# Patient Record
Sex: Female | Born: 1970 | Race: White | Hispanic: No | Smoking: Current every day smoker
Health system: Southern US, Community
[De-identification: ages and names within clinical notes are randomized; demographics above are authoritative.]

## PROBLEM LIST (undated history)

## (undated) DIAGNOSIS — I10 Essential (primary) hypertension: Secondary | ICD-10-CM

## (undated) DIAGNOSIS — G43909 Migraine, unspecified, not intractable, without status migrainosus: Secondary | ICD-10-CM

## (undated) DIAGNOSIS — H5440 Blindness, one eye, unspecified eye: Secondary | ICD-10-CM

## (undated) DIAGNOSIS — R32 Unspecified urinary incontinence: Secondary | ICD-10-CM

## (undated) DIAGNOSIS — I639 Cerebral infarction, unspecified: Secondary | ICD-10-CM

## (undated) DIAGNOSIS — E042 Nontoxic multinodular goiter: Secondary | ICD-10-CM

## (undated) DIAGNOSIS — C4490 Unspecified malignant neoplasm of skin, unspecified: Secondary | ICD-10-CM

## (undated) DIAGNOSIS — K219 Gastro-esophageal reflux disease without esophagitis: Secondary | ICD-10-CM

## (undated) DIAGNOSIS — I341 Nonrheumatic mitral (valve) prolapse: Secondary | ICD-10-CM

## (undated) DIAGNOSIS — I959 Hypotension, unspecified: Secondary | ICD-10-CM

## (undated) DIAGNOSIS — R079 Chest pain, unspecified: Secondary | ICD-10-CM

## (undated) DIAGNOSIS — E785 Hyperlipidemia, unspecified: Secondary | ICD-10-CM

## (undated) DIAGNOSIS — H409 Unspecified glaucoma: Secondary | ICD-10-CM

## (undated) DIAGNOSIS — E119 Type 2 diabetes mellitus without complications: Secondary | ICD-10-CM

## (undated) HISTORY — DX: Unspecified urinary incontinence: R32

## (undated) HISTORY — PX: FOOT SURGERY: SHX648

## (undated) HISTORY — DX: Hyperlipidemia, unspecified: E78.5

## (undated) HISTORY — PX: TUBAL LIGATION: SHX77

## (undated) HISTORY — DX: Unspecified malignant neoplasm of skin, unspecified: C44.90

## (undated) HISTORY — DX: Gastro-esophageal reflux disease without esophagitis: K21.9

## (undated) HISTORY — DX: Blindness, one eye, unspecified eye: H54.40

## (undated) HISTORY — DX: Cerebral infarction, unspecified: I63.9

## (undated) HISTORY — PX: NM RENAL LASIX (ARMC HX): HXRAD1213

## (undated) HISTORY — DX: Migraine, unspecified, not intractable, without status migrainosus: G43.909

## (undated) HISTORY — DX: Nontoxic multinodular goiter: E04.2

## (undated) HISTORY — DX: Essential (primary) hypertension: I10

## (undated) HISTORY — DX: Type 2 diabetes mellitus without complications: E11.9

## (undated) HISTORY — DX: Unspecified glaucoma: H40.9

## (undated) HISTORY — PX: BREAST ENHANCEMENT SURGERY: SHX7

## (undated) HISTORY — DX: Nonrheumatic mitral (valve) prolapse: I34.1

## (undated) HISTORY — PX: NOSE SURGERY: SHX723

---

## 1997-11-22 ENCOUNTER — Encounter: Admission: RE | Admit: 1997-11-22 | Discharge: 1998-02-20 | Payer: Self-pay | Admitting: Family Medicine

## 1998-01-17 ENCOUNTER — Emergency Department (HOSPITAL_COMMUNITY): Admission: EM | Admit: 1998-01-17 | Discharge: 1998-01-17 | Payer: Self-pay | Admitting: Emergency Medicine

## 1998-01-18 ENCOUNTER — Inpatient Hospital Stay (HOSPITAL_COMMUNITY): Admission: RE | Admit: 1998-01-18 | Discharge: 1998-01-23 | Payer: Self-pay | Admitting: *Deleted

## 1999-06-16 ENCOUNTER — Other Ambulatory Visit: Admission: RE | Admit: 1999-06-16 | Discharge: 1999-06-16 | Payer: Self-pay | Admitting: Obstetrics and Gynecology

## 1999-06-18 ENCOUNTER — Encounter: Admission: RE | Admit: 1999-06-18 | Discharge: 1999-06-18 | Payer: Self-pay | Admitting: Urology

## 1999-06-18 ENCOUNTER — Encounter: Payer: Self-pay | Admitting: Urology

## 2001-06-28 ENCOUNTER — Other Ambulatory Visit: Admission: RE | Admit: 2001-06-28 | Discharge: 2001-06-28 | Payer: Self-pay | Admitting: Obstetrics and Gynecology

## 2001-07-28 ENCOUNTER — Encounter: Payer: Self-pay | Admitting: Emergency Medicine

## 2001-07-28 ENCOUNTER — Emergency Department (HOSPITAL_COMMUNITY): Admission: EM | Admit: 2001-07-28 | Discharge: 2001-07-28 | Payer: Self-pay | Admitting: Emergency Medicine

## 2001-12-30 ENCOUNTER — Encounter: Admission: RE | Admit: 2001-12-30 | Discharge: 2001-12-30 | Payer: Self-pay | Admitting: Family Medicine

## 2001-12-30 ENCOUNTER — Encounter: Payer: Self-pay | Admitting: Family Medicine

## 2002-09-21 ENCOUNTER — Emergency Department (HOSPITAL_COMMUNITY): Admission: EM | Admit: 2002-09-21 | Discharge: 2002-09-21 | Payer: Self-pay | Admitting: Emergency Medicine

## 2002-09-22 ENCOUNTER — Encounter: Payer: Self-pay | Admitting: Emergency Medicine

## 2002-09-22 ENCOUNTER — Ambulatory Visit (HOSPITAL_COMMUNITY): Admission: RE | Admit: 2002-09-22 | Discharge: 2002-09-22 | Payer: Self-pay | Admitting: Emergency Medicine

## 2002-11-07 ENCOUNTER — Encounter: Payer: Self-pay | Admitting: Gastroenterology

## 2002-11-07 ENCOUNTER — Encounter: Admission: RE | Admit: 2002-11-07 | Discharge: 2002-11-07 | Payer: Self-pay | Admitting: Gastroenterology

## 2003-01-30 ENCOUNTER — Encounter: Payer: Self-pay | Admitting: *Deleted

## 2003-01-30 ENCOUNTER — Emergency Department (HOSPITAL_COMMUNITY): Admission: EM | Admit: 2003-01-30 | Discharge: 2003-01-30 | Payer: Self-pay | Admitting: Emergency Medicine

## 2003-03-09 ENCOUNTER — Other Ambulatory Visit: Admission: RE | Admit: 2003-03-09 | Discharge: 2003-03-09 | Payer: Self-pay | Admitting: Obstetrics and Gynecology

## 2003-04-24 ENCOUNTER — Encounter: Admission: RE | Admit: 2003-04-24 | Discharge: 2003-04-24 | Payer: Self-pay | Admitting: Family Medicine

## 2003-04-24 ENCOUNTER — Encounter: Payer: Self-pay | Admitting: Family Medicine

## 2003-11-25 ENCOUNTER — Emergency Department (HOSPITAL_COMMUNITY): Admission: AD | Admit: 2003-11-25 | Discharge: 2003-11-25 | Payer: Self-pay | Admitting: Emergency Medicine

## 2004-01-18 ENCOUNTER — Encounter: Admission: RE | Admit: 2004-01-18 | Discharge: 2004-01-18 | Payer: Self-pay | Admitting: Family Medicine

## 2004-02-07 ENCOUNTER — Encounter: Admission: RE | Admit: 2004-02-07 | Discharge: 2004-02-07 | Payer: Self-pay | Admitting: Family Medicine

## 2004-06-02 ENCOUNTER — Other Ambulatory Visit: Admission: RE | Admit: 2004-06-02 | Discharge: 2004-06-02 | Payer: Self-pay | Admitting: Obstetrics and Gynecology

## 2004-11-06 ENCOUNTER — Inpatient Hospital Stay (HOSPITAL_COMMUNITY): Admission: AD | Admit: 2004-11-06 | Discharge: 2004-11-06 | Payer: Self-pay | Admitting: Obstetrics and Gynecology

## 2004-12-04 ENCOUNTER — Inpatient Hospital Stay (HOSPITAL_COMMUNITY): Admission: AD | Admit: 2004-12-04 | Discharge: 2004-12-06 | Payer: Self-pay | Admitting: Obstetrics and Gynecology

## 2005-01-14 ENCOUNTER — Ambulatory Visit (HOSPITAL_COMMUNITY): Admission: RE | Admit: 2005-01-14 | Discharge: 2005-01-14 | Payer: Self-pay | Admitting: Obstetrics and Gynecology

## 2007-06-03 ENCOUNTER — Emergency Department (HOSPITAL_COMMUNITY): Admission: EM | Admit: 2007-06-03 | Discharge: 2007-06-03 | Payer: Self-pay | Admitting: Emergency Medicine

## 2007-06-03 ENCOUNTER — Inpatient Hospital Stay (HOSPITAL_COMMUNITY): Admission: AD | Admit: 2007-06-03 | Discharge: 2007-06-04 | Payer: Self-pay | Admitting: Psychiatry

## 2007-06-03 ENCOUNTER — Ambulatory Visit: Payer: Self-pay | Admitting: Psychiatry

## 2010-09-14 ENCOUNTER — Encounter: Payer: Self-pay | Admitting: Family Medicine
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
## 2011-01-09 NOTE — Op Note (Signed)
NAME:  Kristen Rowland, Kristen Rowland                ACCOUNT NO.:  [id number]   MEDICAL RECORD NO.:  [id number]          PATIENT TYPE:  AMB   LOCATION:  DAY                          FACILITY:  [facility]   PHYSICIAN:  Malachi Pro. Ambrose Mantle, M.D. DATE OF BIRTH:  12/24/1970   DATE OF PROCEDURE:  01/14/2005  DATE OF DISCHARGE:                                 OPERATIVE REPORT   PREOPERATIVE DIAGNOSIS:  Voluntary sterilization.   POSTOPERATIVE DIAGNOSIS:  Voluntary sterilization.   OPERATION/PROCEDURE:  Laparoscopic tubal cauterization.   SURGEON:  Malachi Pro. Ambrose Mantle, M.D.   ANESTHESIA:  General.   DESCRIPTION OF PROCEDURE:  The patient was brought to the operating room and  placed under satisfactory general anesthesia and placed in the Allen  stirrups in a modified lithotomy position.  Exam revealed the uterus to be  anterior, normal size.  Adnexa free of masses.  The abdomen, vulva, vagina,  and urethra were prepped with Betadine solution and the bladder was emptied  with a [place] catheter.  A Hulka cannula was placed into the uterus and  attached to the anterior cervical lip.  The abdomen was then draped as a  sterile field.  A small incision was made in the inferior portion of the  umbilicus.  A Veress cannula was placed into the peritoneal cavity.  Aspiration revealed it was not in a blood vessel, not in bowel or urinary  contents.  Some of the saline was recovered so I replaced the catheter and  then it was definitely in the peritoneal cavity.  I insufflated the  peritoneal cavity with 3.5 L of CO2.  Then the laparoscopic trocar was  inserted into the peritoneal cavity and then the laparoscope was inserted.  A smaller incision was made suprapubically.  A smaller trocar was inserted  into the peritoneal cavity under direct vision.  Exploration of the upper  abdomen revealed the liver to be smooth and no abdominal abnormalities were  noted.  Exploration of the pelvis revealed a cul-de-sac posteriorly to be  free of disease.  The uterus was anterior and normal size.  The anterior cul-  de-sac was normal.  Right tube and ovary and left tube and ovary were  normal.  Both tubes were cauterized in three or four consecutive locations  with a bipolar current.  Good blanching occurred.  Cauterization was  continued until the needle read 0.  No complications were encountered.  The  lower abdominal trocar was removed.  There was no bleeding from this site.  The pneumoperitoneum was released and then the laparoscope was slowly  withdrawn through the umbilical incision.  There was no significant bleeding  here.  The umbilical incision was closed with interrupted sutures of 3-0  plain catgut.  A subcuticular suture was placed through the lower abdominal  incision and one Steri-Strips was used.  The Hulka cannula was removed and  the procedure was terminated.  Blood loss was less than 10 mL.  Sponge and  needle counts were correct and the patient was returned to recovery in  satisfactory condition.     TFH/MEDQ  D:  01/14/2005  T:  01/14/2005  Job:  [id number]

## 2011-06-04 LAB — URINALYSIS, ROUTINE W REFLEX MICROSCOPIC
Glucose, UA: NEGATIVE
Ketones, ur: NEGATIVE
Nitrite: NEGATIVE
Specific Gravity, Urine: 1.014
Urobilinogen, UA: 0.2
pH: 5.5

## 2011-06-04 LAB — RAPID URINE DRUG SCREEN, HOSP PERFORMED
Amphetamines: NOT DETECTED
Benzodiazepines: NOT DETECTED

## 2011-06-04 LAB — TSH
TSH: 2.089
TSH: 3.899

## 2011-06-04 LAB — COMPREHENSIVE METABOLIC PANEL
Albumin: 4
BUN: 3 — ABNORMAL LOW
Creatinine, Ser: 0.68
Glucose, Bld: 97
Potassium: 4
Sodium: 143
Total Protein: 6.8

## 2011-06-04 LAB — CBC
HCT: 37.3
Hemoglobin: 12.7
Platelets: 375
WBC: 10.1

## 2011-06-04 LAB — ACETAMINOPHEN LEVEL: Acetaminophen (Tylenol), Serum: <10 — ABNORMAL LOW

## 2011-06-04 LAB — DIFFERENTIAL
Eosinophils Absolute: 0.1
Eosinophils Relative: 1
Lymphs Abs: 2.3
Neutro Abs: 7.1

## 2011-06-04 LAB — ETHANOL: Alcohol, Ethyl (B): 71 — ABNORMAL HIGH

## 2011-06-04 LAB — URINE MICROSCOPIC-ADD ON

## 2014-07-02 ENCOUNTER — Inpatient Hospital Stay (HOSPITAL_COMMUNITY)
Admission: EM | Admit: 2014-07-02 | Discharge: 2014-07-10 | DRG: 082 | Disposition: A | Discharge status: Home / Self Care | Payer: Medicaid Other | Attending: General Surgery | Admitting: General Surgery

## 2014-07-02 ENCOUNTER — Emergency Department (HOSPITAL_COMMUNITY): Payer: Medicaid Other

## 2014-07-02 ENCOUNTER — Encounter (HOSPITAL_COMMUNITY): Payer: Self-pay | Admitting: Emergency Medicine

## 2014-07-02 DIAGNOSIS — S022XXA Fracture of nasal bones, initial encounter for closed fracture: Secondary | ICD-10-CM | POA: Diagnosis present

## 2014-07-02 DIAGNOSIS — F17210 Nicotine dependence, cigarettes, uncomplicated: Secondary | ICD-10-CM | POA: Diagnosis present

## 2014-07-02 DIAGNOSIS — S02401A Maxillary fracture, unspecified, initial encounter for closed fracture: Secondary | ICD-10-CM | POA: Diagnosis present

## 2014-07-02 DIAGNOSIS — F10129 Alcohol abuse with intoxication, unspecified: Secondary | ICD-10-CM | POA: Diagnosis present

## 2014-07-02 DIAGNOSIS — S01111A Laceration without foreign body of right eyelid and periocular area, initial encounter: Secondary | ICD-10-CM | POA: Diagnosis present

## 2014-07-02 DIAGNOSIS — F1029 Alcohol dependence with unspecified alcohol-induced disorder: Secondary | ICD-10-CM | POA: Diagnosis present

## 2014-07-02 DIAGNOSIS — S023XXA Fracture of orbital floor, initial encounter for closed fracture: Secondary | ICD-10-CM | POA: Diagnosis present

## 2014-07-02 DIAGNOSIS — I609 Nontraumatic subarachnoid hemorrhage, unspecified: Secondary | ICD-10-CM

## 2014-07-02 DIAGNOSIS — S0292XA Unspecified fracture of facial bones, initial encounter for closed fracture: Secondary | ICD-10-CM | POA: Diagnosis present

## 2014-07-02 DIAGNOSIS — S066XAA Traumatic subarachnoid hemorrhage with loss of consciousness status unknown, initial encounter: Secondary | ICD-10-CM | POA: Diagnosis present

## 2014-07-02 DIAGNOSIS — S069X9A Unspecified intracranial injury with loss of consciousness of unspecified duration, initial encounter: Secondary | ICD-10-CM

## 2014-07-02 DIAGNOSIS — M549 Dorsalgia, unspecified: Secondary | ICD-10-CM | POA: Diagnosis present

## 2014-07-02 DIAGNOSIS — J189 Pneumonia, unspecified organism: Secondary | ICD-10-CM | POA: Diagnosis not present

## 2014-07-02 DIAGNOSIS — S0181XA Laceration without foreign body of other part of head, initial encounter: Secondary | ICD-10-CM

## 2014-07-02 DIAGNOSIS — J9601 Acute respiratory failure with hypoxia: Secondary | ICD-10-CM | POA: Diagnosis not present

## 2014-07-02 DIAGNOSIS — M542 Cervicalgia: Secondary | ICD-10-CM

## 2014-07-02 DIAGNOSIS — S0990XA Unspecified injury of head, initial encounter: Secondary | ICD-10-CM

## 2014-07-02 DIAGNOSIS — Y906 Blood alcohol level of 120-199 mg/100 ml: Secondary | ICD-10-CM | POA: Diagnosis present

## 2014-07-02 DIAGNOSIS — S2002XA Contusion of left breast, initial encounter: Secondary | ICD-10-CM | POA: Diagnosis present

## 2014-07-02 DIAGNOSIS — S2232XA Fracture of one rib, left side, initial encounter for closed fracture: Secondary | ICD-10-CM | POA: Diagnosis present

## 2014-07-02 DIAGNOSIS — S066X9A Traumatic subarachnoid hemorrhage with loss of consciousness of unspecified duration, initial encounter: Secondary | ICD-10-CM | POA: Diagnosis present

## 2014-07-02 DIAGNOSIS — R40241 Glasgow coma scale score 13-15: Secondary | ICD-10-CM | POA: Diagnosis present

## 2014-07-02 DIAGNOSIS — F17200 Nicotine dependence, unspecified, uncomplicated: Secondary | ICD-10-CM | POA: Diagnosis present

## 2014-07-02 DIAGNOSIS — S069XAA Unspecified intracranial injury with loss of consciousness status unknown, initial encounter: Secondary | ICD-10-CM

## 2014-07-02 DIAGNOSIS — F1020 Alcohol dependence, uncomplicated: Secondary | ICD-10-CM | POA: Diagnosis present

## 2014-07-02 DIAGNOSIS — R0602 Shortness of breath: Secondary | ICD-10-CM

## 2014-07-02 DIAGNOSIS — J9600 Acute respiratory failure, unspecified whether with hypoxia or hypercapnia: Secondary | ICD-10-CM | POA: Diagnosis not present

## 2014-07-02 HISTORY — DX: Hypotension, unspecified: I95.9

## 2014-07-02 LAB — BASIC METABOLIC PANEL
Anion gap: 20 — ABNORMAL HIGH (ref 5–15)
BUN: 10 mg/dL (ref 6–23)
CO2: 21 mEq/L (ref 19–32)
Calcium: 8.8 mg/dL (ref 8.4–10.5)
Chloride: 95 mEq/L — ABNORMAL LOW (ref 96–112)
Creatinine, Ser: 0.67 mg/dL (ref 0.50–1.10)
GFR calc Af Amer: >90 mL/min (ref >90)
GFR calc non Af Amer: >90 mL/min (ref >90)
Glucose, Bld: 98 mg/dL (ref 70–99)
Potassium: 3.9 mEq/L (ref 3.7–5.3)
Sodium: 136 mEq/L — ABNORMAL LOW (ref 137–147)

## 2014-07-02 LAB — ETHANOL: Alcohol, Ethyl (B): 137 mg/dL — ABNORMAL HIGH (ref 0–11)

## 2014-07-02 LAB — TYPE AND SCREEN
ABO/RH(D): A POS
Antibody Screen: NEGATIVE

## 2014-07-02 LAB — CBC WITH DIFFERENTIAL/PLATELET
Basophils Absolute: 0.1 10*3/uL (ref 0.0–0.1)
Basophils Relative: 1 % (ref 0–1)
Eosinophils Absolute: 0.1 10*3/uL (ref 0.0–0.7)
Eosinophils Relative: 1 % (ref 0–5)
HCT: 40.5 % (ref 36.0–46.0)
Hemoglobin: 13.5 g/dL (ref 12.0–15.0)
Lymphocytes Relative: 18 % (ref 12–46)
Lymphs Abs: 2.5 10*3/uL (ref 0.7–4.0)
MCH: 31.8 pg (ref 26.0–34.0)
MCHC: 33.3 g/dL (ref 30.0–36.0)
MCV: 95.5 fL (ref 78.0–100.0)
Monocytes Absolute: 1.3 10*3/uL — ABNORMAL HIGH (ref 0.1–1.0)
Monocytes Relative: 10 % (ref 3–12)
Neutro Abs: 10 10*3/uL — ABNORMAL HIGH (ref 1.7–7.7)
Neutrophils Relative %: 72 % (ref 43–77)
Platelets: 408 10*3/uL — ABNORMAL HIGH (ref 150–400)
RBC: 4.24 MIL/uL (ref 3.87–5.11)
RDW: 13.7 % (ref 11.5–15.5)
WBC: 13.9 10*3/uL — ABNORMAL HIGH (ref 4.0–10.5)

## 2014-07-02 LAB — LACTIC ACID, PLASMA: Lactic Acid, Venous: 4.1 mmol/L — ABNORMAL HIGH (ref 0.5–2.2)

## 2014-07-02 LAB — ABO/RH: ABO/RH(D): A POS

## 2014-07-02 LAB — GLUCOSE, CAPILLARY: Glucose-Capillary: 101 mg/dL — ABNORMAL HIGH (ref 70–99)

## 2014-07-02 LAB — PREGNANCY, URINE: Preg Test, Ur: NEGATIVE

## 2014-07-02 MED ORDER — ONDANSETRON HCL 4 MG PO TABS
4.0000 mg | ORAL_TABLET | Freq: Four times a day (QID) | ORAL | Status: DC | PRN
  Administered 2014-07-03 – 2014-07-06 (×2): 4 mg via ORAL
  Filled 2014-07-02 (×3): qty 1

## 2014-07-02 MED ORDER — INFLUENZA VAC SPLIT QUAD 0.5 ML IM SUSY
0.5000 mL | PREFILLED_SYRINGE | INTRAMUSCULAR | Status: AC
  Administered 2014-07-03: 0.5 mL via INTRAMUSCULAR
  Filled 2014-07-02: qty 0.5

## 2014-07-02 MED ORDER — SODIUM CHLORIDE 0.9 % IV SOLN
INTRAVENOUS | Status: DC
  Administered 2014-07-02: via INTRAVENOUS
  Administered 2014-07-03: 100 mL/h via INTRAVENOUS
  Administered 2014-07-04 (×2): via INTRAVENOUS

## 2014-07-02 MED ORDER — FENTANYL CITRATE 0.05 MG/ML IJ SOLN
100.0000 ug | Freq: Once | INTRAMUSCULAR | Status: AC
  Administered 2014-07-02: 100 ug via INTRAVENOUS
  Filled 2014-07-02: qty 2

## 2014-07-02 MED ORDER — PANTOPRAZOLE SODIUM 40 MG IV SOLR
40.0000 mg | Freq: Every day | INTRAVENOUS | Status: DC
  Administered 2014-07-03: 40 mg via INTRAVENOUS
  Filled 2014-07-02 (×2): qty 40

## 2014-07-02 MED ORDER — ONDANSETRON HCL 4 MG/2ML IJ SOLN
4.0000 mg | Freq: Four times a day (QID) | INTRAMUSCULAR | Status: DC | PRN
  Administered 2014-07-02 – 2014-07-09 (×9): 4 mg via INTRAVENOUS
  Filled 2014-07-02 (×9): qty 2

## 2014-07-02 MED ORDER — IOHEXOL 300 MG/ML  SOLN: 100.0000 mL | Freq: Once | INTRAMUSCULAR | Status: DC | PRN

## 2014-07-02 MED ORDER — PNEUMOCOCCAL VAC POLYVALENT 25 MCG/0.5ML IJ INJ
0.5000 mL | INJECTION | INTRAMUSCULAR | Status: AC
  Administered 2014-07-03: 0.5 mL via INTRAMUSCULAR
  Filled 2014-07-02: qty 0.5

## 2014-07-02 MED ORDER — ONDANSETRON HCL 4 MG/2ML IJ SOLN
INTRAMUSCULAR | Status: AC
  Administered 2014-07-02: 4 mg via INTRAVENOUS
  Filled 2014-07-02: qty 2

## 2014-07-02 MED ORDER — MORPHINE SULFATE 2 MG/ML IJ SOLN
2.0000 mg | INTRAMUSCULAR | Status: DC | PRN
  Administered 2014-07-02: 4 mg via INTRAVENOUS
  Administered 2014-07-02 – 2014-07-03 (×5): 2 mg via INTRAVENOUS
  Administered 2014-07-03: 3 mg via INTRAVENOUS
  Administered 2014-07-03 (×3): 2 mg via INTRAVENOUS
  Administered 2014-07-03: 3 mg via INTRAVENOUS
  Administered 2014-07-03 – 2014-07-04 (×5): 2 mg via INTRAVENOUS
  Administered 2014-07-04: 3 mg via INTRAVENOUS
  Administered 2014-07-04 (×2): 2 mg via INTRAVENOUS
  Administered 2014-07-04: 3 mg via INTRAVENOUS
  Administered 2014-07-05 – 2014-07-08 (×13): 2 mg via INTRAVENOUS
  Filled 2014-07-02: qty 1
  Filled 2014-07-02: qty 2
  Filled 2014-07-02 (×6): qty 1
  Filled 2014-07-02: qty 2
  Filled 2014-07-02 (×3): qty 1
  Filled 2014-07-02: qty 2
  Filled 2014-07-02 (×3): qty 1
  Filled 2014-07-02: qty 2
  Filled 2014-07-02: qty 1
  Filled 2014-07-02: qty 2
  Filled 2014-07-02 (×9): qty 1
  Filled 2014-07-02: qty 2
  Filled 2014-07-02 (×4): qty 1

## 2014-07-02 MED ORDER — PANTOPRAZOLE SODIUM 40 MG PO TBEC
40.0000 mg | DELAYED_RELEASE_TABLET | Freq: Every day | ORAL | Status: DC
  Administered 2014-07-05 – 2014-07-07 (×3): 40 mg via ORAL
  Filled 2014-07-02 (×4): qty 1

## 2014-07-02 MED ORDER — LIDOCAINE-EPINEPHRINE 1 %-1:100000 IJ SOLN
10.0000 mL | Freq: Once | INTRAMUSCULAR | Status: AC
  Administered 2014-07-02: 10 mL
  Filled 2014-07-02: qty 1

## 2014-07-02 MED ORDER — ONDANSETRON HCL 4 MG/2ML IJ SOLN
4.0000 mg | Freq: Once | INTRAMUSCULAR | Status: AC
  Administered 2014-07-02: 4 mg via INTRAVENOUS

## 2014-07-02 MED ORDER — SODIUM CHLORIDE 0.9 % IV BOLUS (SEPSIS)
1000.0000 mL | Freq: Once | INTRAVENOUS | Status: AC
  Administered 2014-07-02: 1000 mL via INTRAVENOUS

## 2014-07-02 NOTE — ED Notes (Signed)
Neuro Surgeon called Dr. Tanna SavoyPlunket back.

## 2014-07-02 NOTE — ED Notes (Signed)
Contacted family member Bernette RedbirdKenny at patient request. [phone] Bernette RedbirdKenny states he is en route
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
## 2014-07-02 NOTE — ED Notes (Signed)
Attempted to give report, room occupied. Flow called.
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
## 2014-07-02 NOTE — ED Provider Notes (Signed)
LACERATION REPAIR Performed by: [place]Jordan Hausladen, PA Student Authorized by: Santiago GladLaisure, Waleed Dettman Consent: Verbal consent obtained. Risks and benefits: risks, benefits and alternatives were discussed Consent given by: patient Patient identity confirmed: provided demographic data Prepped and Draped in normal sterile fashion Wound explored  Laceration Location: chin  Laceration Length: 6 cm  No Foreign Bodies seen or palpated  Anesthesia: local infiltration  Local anesthetic: lidocaine 2% with epinephrine  Anesthetic total: 5 ml  Irrigation method: syringe Amount of cleaning: standard  Skin closure: 5-0 Prolene  Number of sutures: 9  Technique: simple interrupted  Patient tolerance: Patient tolerated the procedure well with no immediate complications.   LACERATION REPAIR Performed by: Santiago GladLaisure, Dail Meece Authorized by: Santiago GladLaisure, Noal Abshier Consent: Verbal consent obtained. Risks and benefits: risks, benefits and alternatives were discussed Consent given by: patient Patient identity confirmed: provided demographic data Prepped and Draped in normal sterile fashion Wound explored  Laceration Location: just superior to right upper lip  Laceration Length: 1 cm  No Foreign Bodies seen or palpated  Anesthesia: local infiltration  Local anesthetic: lidocaine 2% with epinephrine  Anesthetic total: 1 ml  Irrigation method: syringe Amount of cleaning: standard  Skin closure: 5-0 Prolene  Number of sutures: 1  Technique: simple interrupted  Patient tolerance: Patient tolerated the procedure well with no immediate complications.   LACERATION REPAIR Performed by: Santiago GladLaisure, Aaralyn Kil Authorized by: Santiago GladLaisure, Navya Timmons Consent: Verbal consent obtained. Risks and benefits: risks, benefits and alternatives were discussed Consent given by: patient Patient identity confirmed: provided demographic data Prepped and Draped in normal sterile fashion Wound explored  Laceration  Location:  Right nasolabial fold  Laceration Length:2 cm  No Foreign Bodies seen or palpated  Anesthesia: local infiltration  Local anesthetic: lidocaine 2% with epinephrine  Anesthetic total: 2 ml  Irrigation method: syringe Amount of cleaning: standard  Skin closure: 5-0 Prolone  Number of sutures: 3  Technique: simple interrupted  Patient tolerance: Patient tolerated the procedure well with no immediate complications.     Santiago GladHeather Maryjayne Kleven, PA-C 07/02/14 1744  Gwyneth SproutWhitney Plunkett, MD 07/03/14 1530

## 2014-07-02 NOTE — ED Notes (Signed)
PA at the bedside to suture pts lacerations.

## 2014-07-02 NOTE — ED Notes (Signed)
Flow called to inquire about bed request.  
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
## 2014-07-02 NOTE — ED Notes (Signed)
Neuro Surgeon at bedside.  

## 2014-07-02 NOTE — ED Notes (Signed)
Patient transported to CT 
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
## 2014-07-02 NOTE — ED Notes (Signed)
Neuro Surgery paged.

## 2014-07-02 NOTE — ED Notes (Signed)
[facility]

## 2014-07-02 NOTE — ED Notes (Signed)
[phone] Kristen Rowland(Kenneth, boyfriend) Please call if you need anything.

## 2014-07-03 ENCOUNTER — Inpatient Hospital Stay (HOSPITAL_COMMUNITY): Payer: Medicaid Other

## 2014-07-03 LAB — CBC
HCT: 36.8 % (ref 36.0–46.0)
Hemoglobin: 12.2 g/dL (ref 12.0–15.0)
MCH: 31.4 pg (ref 26.0–34.0)
MCHC: 33.2 g/dL (ref 30.0–36.0)
MCV: 94.8 fL (ref 78.0–100.0)
PLATELETS: 364 10*3/uL (ref 150–400)
RBC: 3.88 MIL/uL (ref 3.87–5.11)
RDW: 13.8 % (ref 11.5–15.5)
WBC: 10.3 10*3/uL (ref 4.0–10.5)

## 2014-07-03 LAB — MRSA PCR SCREENING: MRSA by PCR: NEGATIVE

## 2014-07-03 LAB — BASIC METABOLIC PANEL
ANION GAP: 17 — AB (ref 5–15)
BUN: 10 mg/dL (ref 6–23)
CHLORIDE: 102 meq/L (ref 96–112)
CO2: 20 meq/L (ref 19–32)
Calcium: 8.5 mg/dL (ref 8.4–10.5)
Creatinine, Ser: 0.71 mg/dL (ref 0.50–1.10)
GFR calc Af Amer: >90 mL/min (ref >90)
GFR calc non Af Amer: >90 mL/min (ref >90)
Glucose, Bld: 93 mg/dL (ref 70–99)
Potassium: 4.2 mEq/L (ref 3.7–5.3)
SODIUM: 139 meq/L (ref 137–147)

## 2014-07-03 MED ORDER — SODIUM CHLORIDE (HYPERTONIC) 5 % OP OINT
TOPICAL_OINTMENT | Freq: Every day | OPHTHALMIC | Status: DC
  Filled 2014-07-03: qty 3.5

## 2014-07-03 MED ORDER — LORAZEPAM 1 MG PO TABS
1.0000 mg | ORAL_TABLET | Freq: Four times a day (QID) | ORAL | Status: DC | PRN
  Administered 2014-07-04: 1 mg via ORAL
  Filled 2014-07-03: qty 1

## 2014-07-03 MED ORDER — SODIUM CHLORIDE (HYPERTONIC) 5 % OP OINT
TOPICAL_OINTMENT | OPHTHALMIC | Status: AC
  Administered 2014-07-03 – 2014-07-04 (×5): via OPHTHALMIC
  Filled 2014-07-03: qty 3.5

## 2014-07-03 MED ORDER — CETYLPYRIDINIUM CHLORIDE 0.05 % MT LIQD
7.0000 mL | Freq: Two times a day (BID) | OROMUCOSAL | Status: DC
  Administered 2014-07-03 – 2014-07-09 (×11): 7 mL via OROMUCOSAL

## 2014-07-03 MED ORDER — THIAMINE HCL 100 MG/ML IJ SOLN
100.0000 mg | Freq: Every day | INTRAMUSCULAR | Status: DC
  Filled 2014-07-03 (×2): qty 1

## 2014-07-03 MED ORDER — CHLORHEXIDINE GLUCONATE 0.12 % MT SOLN
15.0000 mL | Freq: Two times a day (BID) | OROMUCOSAL | Status: DC
  Administered 2014-07-03 – 2014-07-10 (×15): 15 mL via OROMUCOSAL
  Filled 2014-07-03 (×17): qty 15

## 2014-07-03 MED ORDER — LORAZEPAM 1 MG PO TABS
0.0000 mg | ORAL_TABLET | Freq: Four times a day (QID) | ORAL | Status: DC
Start: 2014-07-03 — End: 2014-07-04
  Administered 2014-07-03 – 2014-07-04 (×2): 3 mg via ORAL
  Administered 2014-07-04: 2 mg via ORAL
  Filled 2014-07-03: qty 3
  Filled 2014-07-03: qty 2
  Filled 2014-07-03: qty 4

## 2014-07-03 MED ORDER — LORAZEPAM 2 MG/ML IJ SOLN: 1.0000 mg | Freq: Four times a day (QID) | INTRAMUSCULAR | Status: DC | PRN

## 2014-07-03 MED ORDER — FOLIC ACID 1 MG PO TABS
1.0000 mg | ORAL_TABLET | Freq: Every day | ORAL | Status: DC
  Administered 2014-07-03 – 2014-07-10 (×8): 1 mg via ORAL
  Filled 2014-07-03 (×8): qty 1

## 2014-07-03 MED ORDER — VITAMIN B-1 100 MG PO TABS
100.0000 mg | ORAL_TABLET | Freq: Every day | ORAL | Status: DC
  Administered 2014-07-03 – 2014-07-10 (×8): 100 mg via ORAL
  Filled 2014-07-03 (×8): qty 1

## 2014-07-03 MED ORDER — LORAZEPAM 1 MG PO TABS: 0.0000 mg | ORAL_TABLET | Freq: Two times a day (BID) | ORAL | Status: DC

## 2014-07-03 MED ORDER — ADULT MULTIVITAMIN W/MINERALS CH
1.0000 | ORAL_TABLET | Freq: Every day | ORAL | Status: DC
  Administered 2014-07-03 – 2014-07-10 (×7): 1 via ORAL
  Filled 2014-07-03 (×8): qty 1

## 2014-07-03 MED ORDER — NICOTINE 21 MG/24HR TD PT24
21.0000 mg | MEDICATED_PATCH | Freq: Every day | TRANSDERMAL | Status: DC
  Administered 2014-07-03 – 2014-07-10 (×8): 21 mg via TRANSDERMAL
  Filled 2014-07-03 (×8): qty 1

## 2014-07-03 NOTE — Progress Notes (Signed)
UR completed.  Sonal Dorwart, RN BSN [facility] CCM Trauma/Neuro ICU Case Manager [phone]  
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
## 2014-07-03 NOTE — Progress Notes (Signed)
OT Cancellation Note  Patient Details Name: Kristen BloodgoodCarla B Rowland MRN: [id number] DOB: June 16, 1971   Cancelled Treatment:    Reason Eval/Treat Not Completed: Other (comment) (transferring units) - will reattempt.   Angelene GiovanniConarpe, Lynda Capistran M  Daneya Hartgrove Ghentonarpe, OTR/L [phone] 07/03/2014, 3:27 PM
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
## 2014-07-03 NOTE — Plan of Care (Signed)
Problem: Consults Goal: General Medical Patient Education See Patient Education Module for specific education. Outcome: Completed/Met Date Met:  07/03/14 Goal: Skin Care Protocol Initiated - if Braden Score 18 or less If consults are not indicated, leave blank or document N/A Outcome: Completed/Met Date Met:  07/03/14 Goal: Nutrition Consult-if indicated Outcome: Not Applicable Date Met:  62/03/55 Goal: Diabetes Guidelines if Diabetic/Glucose > 140 If diabetic or lab glucose is > 140 mg/dl - Initiate Diabetes/Hyperglycemia Guidelines & Document Interventions  Outcome: Not Applicable Date Met:  97/41/63  Problem: Phase I Progression Outcomes Goal: Pain controlled with appropriate interventions Outcome: Progressing Goal: OOB as tolerated unless otherwise ordered Outcome: Progressing Goal: Voiding-avoid urinary catheter unless indicated Outcome: Completed/Met Date Met:  07/03/14 Goal: Hemodynamically stable Outcome: Completed/Met Date Met:  07/03/14 Goal: Other Phase I Outcomes/Goals Outcome: Completed/Met Date Met:  07/03/14
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
## 2014-07-03 NOTE — Progress Notes (Signed)
SLP Cancellation Note  Patient Details Name: Kristen BloodgoodCarla B Leyba MRN: [id number] DOB: 1971/07/31   Cancelled evaluation:       Reason Eval/Treat Not Completed: Patient unavailable; will return this pm or next date as schedule allows.     Blenda MountsCouture, Sally-Anne Wamble Laurice 07/03/2014, 12:01 PM
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
## 2014-07-04 ENCOUNTER — Inpatient Hospital Stay (HOSPITAL_COMMUNITY): Payer: Medicaid Other

## 2014-07-04 LAB — BASIC METABOLIC PANEL
ANION GAP: 17 — AB (ref 5–15)
BUN: 6 mg/dL (ref 6–23)
CHLORIDE: 100 meq/L (ref 96–112)
CO2: 21 meq/L (ref 19–32)
Calcium: 8.3 mg/dL — ABNORMAL LOW (ref 8.4–10.5)
Creatinine, Ser: 0.56 mg/dL (ref 0.50–1.10)
GFR calc non Af Amer: >90 mL/min (ref >90)
Glucose, Bld: 89 mg/dL (ref 70–99)
POTASSIUM: 4.2 meq/L (ref 3.7–5.3)
SODIUM: 138 meq/L (ref 137–147)

## 2014-07-04 LAB — CBC
HCT: 33.8 % — ABNORMAL LOW (ref 36.0–46.0)
Hemoglobin: 11.1 g/dL — ABNORMAL LOW (ref 12.0–15.0)
MCH: 31.7 pg (ref 26.0–34.0)
MCHC: 32.8 g/dL (ref 30.0–36.0)
MCV: 96.6 fL (ref 78.0–100.0)
PLATELETS: 354 10*3/uL (ref 150–400)
RBC: 3.5 MIL/uL — AB (ref 3.87–5.11)
RDW: 13.9 % (ref 11.5–15.5)
WBC: 15.1 10*3/uL — ABNORMAL HIGH (ref 4.0–10.5)

## 2014-07-04 MED ORDER — LEVOFLOXACIN IN D5W 750 MG/150ML IV SOLN
750.0000 mg | INTRAVENOUS | Status: DC
  Administered 2014-07-04 – 2014-07-06 (×3): 750 mg via INTRAVENOUS
  Filled 2014-07-04 (×4): qty 150

## 2014-07-04 MED ORDER — LORAZEPAM 2 MG/ML IJ SOLN
2.0000 mg | INTRAMUSCULAR | Status: DC | PRN
  Administered 2014-07-06: 2 mg via INTRAVENOUS
  Filled 2014-07-04: qty 1

## 2014-07-04 MED ORDER — ALBUTEROL SULFATE (2.5 MG/3ML) 0.083% IN NEBU
2.5000 mg | INHALATION_SOLUTION | RESPIRATORY_TRACT | Status: DC
  Administered 2014-07-04 – 2014-07-08 (×23): 2.5 mg via RESPIRATORY_TRACT
  Filled 2014-07-04 (×25): qty 3

## 2014-07-04 NOTE — Progress Notes (Signed)
While giving pt her morning meds, pt became agitated and would not leave her non-rebreather mask on. Due to this, her O2 stats dropped to the 60's. Pt continued to refuse O2 mask. MD paged and arrived immediately. MD nasal suctioned pt and her stats improved to 90's with non-rebreather mask. Order to transfer pt to ICU. Report called and given to Florentina AddisonKatie, Charity fundraiserN. Charge RN transferred pt with portable O2 monitor to CT and then transferred to ICU.
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
## 2014-07-05 MED ORDER — OXYCODONE HCL 5 MG PO TABS
5.0000 mg | ORAL_TABLET | ORAL | Status: DC | PRN
  Administered 2014-07-05 – 2014-07-06 (×4): 10 mg via ORAL
  Administered 2014-07-06: 15 mg via ORAL
  Administered 2014-07-06 – 2014-07-08 (×4): 10 mg via ORAL
  Administered 2014-07-08: 15 mg via ORAL
  Administered 2014-07-08: 10 mg via ORAL
  Administered 2014-07-09: 15 mg via ORAL
  Filled 2014-07-05: qty 3
  Filled 2014-07-05 (×3): qty 2
  Filled 2014-07-05: qty 3
  Filled 2014-07-05 (×2): qty 2
  Filled 2014-07-05: qty 3
  Filled 2014-07-05 (×4): qty 2

## 2014-07-05 MED ORDER — GUAIFENESIN ER 600 MG PO TB12
600.0000 mg | ORAL_TABLET | Freq: Two times a day (BID) | ORAL | Status: DC
  Administered 2014-07-05 – 2014-07-10 (×11): 600 mg via ORAL
  Filled 2014-07-05 (×14): qty 1

## 2014-07-05 MED ORDER — ENSURE COMPLETE PO LIQD
237.0000 mL | Freq: Two times a day (BID) | ORAL | Status: DC
  Administered 2014-07-05 – 2014-07-09 (×5): 237 mL via ORAL

## 2014-07-05 MED ORDER — TRAMADOL HCL 50 MG PO TABS
50.0000 mg | ORAL_TABLET | Freq: Four times a day (QID) | ORAL | Status: DC | PRN
  Administered 2014-07-06 – 2014-07-07 (×3): 50 mg via ORAL
  Filled 2014-07-05 (×3): qty 1
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
## 2014-07-05 NOTE — Progress Notes (Signed)
SLP Cancellation Note  Patient Details Name: Kristen Rowland MRN: [id number] DOB: March 21, 1971   Cancelled treatment:        patient sleeping soundly, lethargic when woken. Will f/u 11/13                                                                                                Mandell Pangborn Meryl 07/05/2014, 3:18 PM

## 2014-07-05 NOTE — Progress Notes (Signed)
Rehab Admissions Coordinator Note:  Patient was screened by Trish MageLogue, Larance Ratledge M for appropriateness for an Inpatient Acute Rehab Consult.  At this time, we are recommending Inpatient Rehab consult.  Trish MageLogue, Juston Goheen M 07/05/2014, 3:39 PM  I can be reached at [phone].

## 2014-07-05 NOTE — Progress Notes (Signed)
2000 Pt c/o of coughing and pain. Pt given prn pain medication. Pt c/o the pain medication is not enough. Pt requests more pain medication and request cough medication.Md notified. RN attempts to NTS pt refuses and pull at RN hand.Pt is educauted on why she needs to deep breath and cough and move her secretions.  Pt is sleepy but responds to voice.  2310 pt pox is 88-91 on [place] 6l o2. Pt placed on venti mask at 55%. Pt educated that she has to leave the mask on. Pox increases to 95%. Pt is instructed to deep breath and cough pt refuses at this time.  2330 pt agrees to use IS and pulls 500. Respiratory is at bedside and encourages pt with Flutter valve use. Pt refuses to cough and deep breath. NTS suction is attempted pt becomes agitated and starts to swing at the RN.  Pt again educated on why she needs to cough and deep breath. Pt attempts to cough. 220030 RN notified of pt low POX 85%. Pt is found with Venit mask removed. Pt refused to have mask placed back on. Pt educated on why she needs the extra o2. Mask placed back on pt.  0230 Pt is found again with mask off. She is reeducated and make placed back on . IS is done pt pulls 250 X2 and refuses to do more. 0400 pt is refusing to wear 02. Pt is educated and mask is placed back on.  0600 pt is found with pox prob off and mask off. Mask placed back on pt.  Pt placed back on monitor. POX82%. Pt encourage to deep breath and cough and use IS. Pt has a weak cough and only pull 200 on IS. Will continue to monitor pt.
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
## 2014-07-06 ENCOUNTER — Inpatient Hospital Stay (HOSPITAL_COMMUNITY): Payer: Medicaid Other

## 2014-07-06 LAB — CBC
HEMATOCRIT: 31.8 % — AB (ref 36.0–46.0)
Hemoglobin: 10.6 g/dL — ABNORMAL LOW (ref 12.0–15.0)
MCH: 31.8 pg (ref 26.0–34.0)
MCHC: 33.3 g/dL (ref 30.0–36.0)
MCV: 95.5 fL (ref 78.0–100.0)
Platelets: 313 10*3/uL (ref 150–400)
RBC: 3.33 MIL/uL — ABNORMAL LOW (ref 3.87–5.11)
RDW: 13.6 % (ref 11.5–15.5)
WBC: 8.7 10*3/uL (ref 4.0–10.5)

## 2014-07-06 MED ORDER — WHITE PETROLATUM GEL
Status: AC
  Administered 2014-07-06: 1
  Filled 2014-07-06: qty 5
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
## 2014-07-06 NOTE — Progress Notes (Signed)
Late entry  Subjective: Patient seen in the medical ICU.  Stable overnight.  No new complaints.  Objective:  Physical Exam:  Somewhat lethargic, but oriented.  Following commands.  Moving all 4 extremities to command.  Significant right facial swelling.  Assessment/Plan: We'll need continued neuro checks, and may need follow-up CT of the head. We'll continue to follow with the trauma surgical service and other treating services.  No indication for neurosurgical intervention at this time.   Hewitt ShortsNUDELMAN,ROBERT W, MD 07/06/2014, 9:36 AM
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
## 2014-07-06 NOTE — Progress Notes (Signed)
Eugenie FillerDianna Rowland is the patient's workers comp Sports coachcase manager with [employer]. Her phone number is [phone].  Fax is [phone].  She will take care of any d/c needs if we fax the orders to her. She requested clinical update to be provided to the adjuster.  This request was received in front of the patient and I asked the patient directly if it was ok to provide the CM with her medical information.  The patient stated that I could provide this to her.  CM given medical information needed.   Carlyle LipaMichelle Tavarus Poteete, RN BSN MHA CCM  Case Manager, Trauma Service/Unit 88[facility] [phone]
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
## 2014-07-07 MED ORDER — DOCUSATE SODIUM 100 MG PO CAPS
100.0000 mg | ORAL_CAPSULE | Freq: Two times a day (BID) | ORAL | Status: DC
  Administered 2014-07-07 – 2014-07-10 (×7): 100 mg via ORAL
  Filled 2014-07-07 (×7): qty 1

## 2014-07-07 MED ORDER — LEVOFLOXACIN 750 MG PO TABS
750.0000 mg | ORAL_TABLET | Freq: Every day | ORAL | Status: DC
  Administered 2014-07-07 – 2014-07-10 (×4): 750 mg via ORAL
  Filled 2014-07-07 (×4): qty 1

## 2014-07-07 MED ORDER — TRAMADOL HCL 50 MG PO TABS
100.0000 mg | ORAL_TABLET | Freq: Four times a day (QID) | ORAL | Status: DC
  Administered 2014-07-07 – 2014-07-08 (×3): 100 mg via ORAL
  Filled 2014-07-07 (×3): qty 2

## 2014-07-07 MED ORDER — DIPHENHYDRAMINE HCL 50 MG/ML IJ SOLN
25.0000 mg | Freq: Four times a day (QID) | INTRAMUSCULAR | Status: DC | PRN
  Administered 2014-07-07 – 2014-07-10 (×10): 25 mg via INTRAVENOUS
  Filled 2014-07-07 (×10): qty 1

## 2014-07-07 MED ORDER — DIPHENHYDRAMINE HCL 50 MG/ML IJ SOLN
INTRAMUSCULAR | Status: AC
  Filled 2014-07-07: qty 1

## 2014-07-07 MED ORDER — POLYETHYLENE GLYCOL 3350 17 G PO PACK
17.0000 g | PACK | Freq: Every day | ORAL | Status: DC
  Administered 2014-07-09 – 2014-07-10 (×2): 17 g via ORAL
  Filled 2014-07-07 (×4): qty 1

## 2014-07-07 NOTE — Progress Notes (Addendum)
Pt c/o of itching s/p receiving tramadol. Paged Dr Corliss Skainssuei. Ordered benadryl prn. Will give dose now and continue to monitor.
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
## 2014-07-07 NOTE — Progress Notes (Signed)
ANTIBIOTIC CONSULT NOTE - FOLLOW UP  Pharmacy Consult for Levaquin Indication: CAP  No Known Allergies  Patient Measurements: Height: 5\' 10"  (177.8 cm) Weight: 146 lb 2.6 oz (66.3 kg) IBW/kg (Calculated) : 68.5   Vital Signs: Temp: 98 F (36.7 C) (11/14 0315) Temp Source: Oral (11/14 0315) BP: 102/61 mmHg (11/14 0315) Pulse Rate: 87 (11/14 0400) Intake/Output from previous day: 11/13 0701 - 11/14 0700 In: 740 [P.O.:520; I.V.:220] Out: -  Intake/Output from this shift:    Labs:  Recent Labs  07/06/14 0320  WBC 8.7  HGB 10.6*  PLT 313   Estimated Creatinine Clearance: 92.9 mL/min (by C-G formula based on Cr of 0.56). No results for input(s): VANCOTROUGH, VANCOPEAK, VANCORANDOM, GENTTROUGH, GENTPEAK, GENTRANDOM, TOBRATROUGH, TOBRAPEAK, TOBRARND, AMIKACINPEAK, AMIKACINTROU, AMIKACIN in the last 72 hours.   Microbiology: Recent Results (from the past 720 hour(s))  MRSA PCR Screening     Status: None   Collection Time: 07/03/14  1:37 AM  Result Value Ref Range Status   MRSA by PCR NEGATIVE NEGATIVE Final    Comment:        The GeneXpert MRSA Assay (FDA approved for NASAL specimens only), is one component of a comprehensive MRSA colonization surveillance program. It is not intended to diagnose MRSA infection nor to guide or monitor treatment for MRSA infections.     Anti-infectives    Start     Dose/Rate Route Frequency Ordered Stop   07/07/14 1300  levofloxacin (LEVAQUIN) tablet 750 mg     750 mg Oral Daily 07/07/14 0725     07/04/14 1300  levofloxacin (LEVAQUIN) IVPB 750 mg  Status:  Discontinued     750 mg100 mL/hr over 90 Minutes Intravenous Every 24 hours 07/04/14 1206 07/07/14 0725      Assessment: 43 yoF on day 4 of levofloxacin for CAP.  Patient is afebrile, WBC and renal function are stable. She is still on 3 L of O2, but overall she appears to be improving. Since she is tolerating oral medications and appears to be clinically stable, will change  to oral levofloxacin.  Goal of Therapy:  Resolution of infection  Plan:  1) Levaquin 750 mg PO q 24h 2) Will f/u renal function, micro data, and patient's tolerance of oral therapy  Russ HaloAshley Azavion Bouillon, PharmD Clinical Pharmacist - Resident Pager: [phone] 11/14/20157:31 AM

## 2014-07-07 NOTE — Progress Notes (Signed)
Patient ID: Thalia BloodgoodCarla B Labell, female   DOB: 1971/04/09, 43 y.o.   MRN: [id number] BP 102/61 mmHg  Pulse 87  Temp(Src) 98.1 F (36.7 C) (Axillary)  Resp 17  Ht 5\' 10"  (1.778 m)  Wt 66.3 kg (146 lb 2.6 oz)  BMI 20.97 kg/m2  SpO2 96%  LMP 06/25/2014 (LMP Unknown) Alert and oriented x 4, speech is clear, fluent Perrl, ptosis right eye,  Right facial weakness Exam is improving Repeat ct for monday

## 2014-07-08 ENCOUNTER — Inpatient Hospital Stay (HOSPITAL_COMMUNITY): Payer: Medicaid Other

## 2014-07-08 DIAGNOSIS — J9600 Acute respiratory failure, unspecified whether with hypoxia or hypercapnia: Secondary | ICD-10-CM | POA: Diagnosis not present

## 2014-07-08 DIAGNOSIS — S0292XA Unspecified fracture of facial bones, initial encounter for closed fracture: Secondary | ICD-10-CM | POA: Diagnosis present

## 2014-07-08 DIAGNOSIS — F1020 Alcohol dependence, uncomplicated: Secondary | ICD-10-CM | POA: Diagnosis present

## 2014-07-08 DIAGNOSIS — J189 Pneumonia, unspecified organism: Secondary | ICD-10-CM | POA: Diagnosis not present

## 2014-07-08 DIAGNOSIS — S0181XA Laceration without foreign body of other part of head, initial encounter: Secondary | ICD-10-CM | POA: Diagnosis present

## 2014-07-08 DIAGNOSIS — F17200 Nicotine dependence, unspecified, uncomplicated: Secondary | ICD-10-CM | POA: Diagnosis present

## 2014-07-08 DIAGNOSIS — S2002XA Contusion of left breast, initial encounter: Secondary | ICD-10-CM | POA: Diagnosis present

## 2014-07-08 MED ORDER — MORPHINE SULFATE 2 MG/ML IJ SOLN
2.0000 mg | INTRAMUSCULAR | Status: DC | PRN
  Administered 2014-07-08 – 2014-07-09 (×4): 2 mg via INTRAVENOUS
  Filled 2014-07-08 (×4): qty 1

## 2014-07-08 MED ORDER — IPRATROPIUM-ALBUTEROL 0.5-2.5 (3) MG/3ML IN SOLN
3.0000 mL | Freq: Four times a day (QID) | RESPIRATORY_TRACT | Status: DC
  Administered 2014-07-09 – 2014-07-10 (×6): 3 mL via RESPIRATORY_TRACT
  Filled 2014-07-08 (×5): qty 3
  Filled 2014-07-08: qty 39

## 2014-07-08 MED ORDER — ALBUTEROL SULFATE (2.5 MG/3ML) 0.083% IN NEBU: 2.5000 mg | INHALATION_SOLUTION | RESPIRATORY_TRACT | Status: DC | PRN

## 2014-07-08 MED ORDER — TRAMADOL HCL 50 MG PO TABS
100.0000 mg | ORAL_TABLET | Freq: Four times a day (QID) | ORAL | Status: DC
  Administered 2014-07-08: 100 mg via ORAL
  Administered 2014-07-08 (×2): 50 mg via ORAL
  Administered 2014-07-09 – 2014-07-10 (×6): 100 mg via ORAL
  Filled 2014-07-08 (×8): qty 2

## 2014-07-08 MED ORDER — BACITRACIN ZINC 500 UNIT/GM EX OINT
TOPICAL_OINTMENT | Freq: Two times a day (BID) | CUTANEOUS | Status: DC
  Administered 2014-07-08: 15.5556 via TOPICAL
  Administered 2014-07-08: 12:00:00 via TOPICAL
  Administered 2014-07-09: 1 via TOPICAL
  Administered 2014-07-09: 15.5556 via TOPICAL
  Administered 2014-07-10: 1 via TOPICAL
  Filled 2014-07-08: qty 28.35
  Filled 2014-07-08 (×2): qty 15
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
## 2014-07-08 NOTE — Progress Notes (Signed)
Respiratory Assessment done and patient changed to DUOneb QID and flutter QID post treatments. Patient doing okay, encouraged cough and deep breathing. Patient has pillow splint and was doing okay with it. Will reassess in two days and PRN for any changes

## 2014-07-09 ENCOUNTER — Inpatient Hospital Stay (HOSPITAL_COMMUNITY): Payer: Medicaid Other

## 2014-07-09 DIAGNOSIS — S069X2A Unspecified intracranial injury with loss of consciousness of 31 minutes to 59 minutes, initial encounter: Secondary | ICD-10-CM

## 2014-07-09 MED ORDER — OXYCODONE HCL 5 MG PO TABS
10.0000 mg | ORAL_TABLET | ORAL | Status: DC | PRN
  Administered 2014-07-09 – 2014-07-10 (×7): 20 mg via ORAL
  Filled 2014-07-09 (×8): qty 4
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
## 2014-07-09 NOTE — Progress Notes (Signed)
PT Cancellation Note  Patient Details Name: Kristen BloodgoodCarla B Rowland MRN: [id number] DOB: 1971-06-17   Cancelled Treatment:    Reason Eval/Treat Not Completed: Other (comment). Attempted to see patient this afternoon but patient stated that the MD and the rehab supervisor told her this AM that she did not have to do therapy that "she needed time to heal". Attempted to educate patient on the MD orders and therapy eval but patient continued to refuse. Patient stated that she has been up walking in room. Will attempt later as able   Robinette, Adline PotterJulia Elizabeth 07/09/2014, 1:31 PM
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
## 2014-07-09 NOTE — Progress Notes (Signed)
Rehab admissions - Evaluated for possible admission.  Please see rehab MD consult done tody by Dr. Riley KillSwartz [facility].  Patient is already doing too well to me criteria for acute inpatient rehab admission.  Call me for questions.  #[phone]

## 2014-07-10 DIAGNOSIS — S2232XA Fracture of one rib, left side, initial encounter for closed fracture: Secondary | ICD-10-CM | POA: Diagnosis present

## 2014-07-10 MED ORDER — ADULT MULTIVITAMIN W/MINERALS CH: 1.0000 | ORAL_TABLET | Freq: Every day | ORAL | Status: DC

## 2014-07-10 MED ORDER — FOLIC ACID 1 MG PO TABS: 1.0000 mg | ORAL_TABLET | Freq: Every day | ORAL | Status: DC

## 2014-07-10 MED ORDER — THIAMINE HCL 100 MG PO TABS: 100.0000 mg | ORAL_TABLET | Freq: Every day | ORAL | Status: DC

## 2014-07-10 MED ORDER — METHOCARBAMOL 500 MG PO TABS: 1000.0000 mg | ORAL_TABLET | Freq: Three times a day (TID) | ORAL | Status: DC

## 2014-07-10 MED ORDER — METHOCARBAMOL 500 MG PO TABS
1000.0000 mg | ORAL_TABLET | Freq: Three times a day (TID) | ORAL | Status: DC
  Administered 2014-07-10 (×2): 1000 mg via ORAL
  Filled 2014-07-10 (×2): qty 2

## 2014-07-10 MED ORDER — OXYCODONE HCL 10 MG PO TABS: 10.0000 mg | ORAL_TABLET | Freq: Four times a day (QID) | ORAL | Status: DC | PRN

## 2014-07-10 MED ORDER — LEVOFLOXACIN 750 MG PO TABS: 750.0000 mg | ORAL_TABLET | Freq: Every day | ORAL | Status: DC

## 2014-07-10 MED ORDER — POLYETHYLENE GLYCOL 3350 17 G PO PACK: 17.0000 g | PACK | Freq: Every day | ORAL | Status: DC

## 2014-07-10 MED ORDER — NAPROXEN 500 MG PO TABS: 500.0000 mg | ORAL_TABLET | Freq: Two times a day (BID) | ORAL | Status: DC

## 2014-07-10 MED ORDER — NAPROXEN 500 MG PO TABS
500.0000 mg | ORAL_TABLET | Freq: Two times a day (BID) | ORAL | Status: DC
  Filled 2014-07-10 (×2): qty 1

## 2014-07-10 MED ORDER — ALBUTEROL SULFATE HFA 108 (90 BASE) MCG/ACT IN AERS: 1.0000 | INHALATION_SPRAY | Freq: Four times a day (QID) | RESPIRATORY_TRACT | Status: DC

## 2014-07-10 MED ORDER — ACETAMINOPHEN 325 MG PO TABS: 325.0000 mg | ORAL_TABLET | Freq: Four times a day (QID) | ORAL | Status: DC | PRN

## 2014-07-10 MED ORDER — GUAIFENESIN ER 600 MG PO TB12: 600.0000 mg | ORAL_TABLET | Freq: Two times a day (BID) | ORAL | Status: DC

## 2014-07-10 MED ORDER — DSS 100 MG PO CAPS: 100.0000 mg | ORAL_CAPSULE | Freq: Two times a day (BID) | ORAL | Status: DC

## 2014-07-10 MED ORDER — TRAMADOL HCL 50 MG PO TABS: 100.0000 mg | ORAL_TABLET | Freq: Four times a day (QID) | ORAL | Status: DC | PRN

## 2014-07-10 NOTE — Discharge Instructions (Signed)
Albuterol inhalation aerosol °What is this medicine? °ALBUTEROL (al BYOO ter ole) is a bronchodilator. It helps open up the airways in your lungs to make it easier to breathe. This medicine is used to treat and to prevent bronchospasm. °This medicine may be used for other purposes; ask your health care provider or pharmacist if you have questions. °COMMON BRAND NAME(S): Proair HFA, Proventil, Proventil HFA, Respirol, Ventolin, Ventolin HFA °What should I tell my health care provider before I take this medicine? °They need to know if you have any of the following conditions: °-diabetes °-heart disease or irregular heartbeat °-high blood pressure °-pheochromocytoma °-seizures °-thyroid disease °-an unusual or allergic reaction to albuterol, levalbuterol, sulfites, other medicines, foods, dyes, or preservatives °-pregnant or trying to get pregnant °-breast-feeding °How should I use this medicine? °This medicine is for inhalation through the mouth. Follow the directions on your prescription label. Take your medicine at regular intervals. Do not use more often than directed. Make sure that you are using your inhaler correctly. Ask you doctor or health care provider if you have any questions. °Talk to your pediatrician regarding the use of this medicine in children. Special care may be needed. °Overdosage: If you think you have taken too much of this medicine contact a poison control center or emergency room at once. °NOTE: This medicine is only for you. Do not share this medicine with others. °What if I miss a dose? °If you miss a dose, use it as soon as you can. If it is almost time for your next dose, use only that dose. Do not use double or extra doses. °What may interact with this medicine? °-anti-infectives like chloroquine and pentamidine °-caffeine °-cisapride °-diuretics °-medicines for colds °-medicines for depression or for emotional or psychotic conditions °-medicines for weight loss including some herbal  products °-methadone °-some antibiotics like clarithromycin, erythromycin, levofloxacin, and linezolid °-some heart medicines °-steroid hormones like dexamethasone, cortisone, hydrocortisone °-theophylline °-thyroid hormones °This list may not describe all possible interactions. Give your health care provider a list of all the medicines, herbs, non-prescription drugs, or dietary supplements you use. Also tell them if you smoke, drink alcohol, or use illegal drugs. Some items may interact with your medicine. °What should I watch for while using this medicine? °Tell your doctor or health care professional if your symptoms do not improve. Do not use extra albuterol. If your asthma or bronchitis gets worse while you are using this medicine, call your doctor right away. °If your mouth gets dry try chewing sugarless gum or sucking hard candy. Drink water as directed. °What side effects may I notice from receiving this medicine? °Side effects that you should report to your doctor or health care professional as soon as possible: °-allergic reactions like skin rash, itching or hives, swelling of the face, lips, or tongue °-breathing problems °-chest pain °-feeling faint or lightheaded, falls °-high blood pressure °-irregular heartbeat °-fever °-muscle cramps or weakness °-pain, tingling, numbness in the hands or feet °-vomiting °Side effects that usually do not require medical attention (report to your doctor or health care professional if they continue or are bothersome): °-cough °-difficulty sleeping °-headache °-nervousness or trembling °-stomach upset °-stuffy or runny nose °-throat irritation °-unusual taste °This list may not describe all possible side effects. Call your doctor for medical advice about side effects. You may report side effects to FDA at 1-800-FDA-1088. °Where should I keep my medicine? °Keep out of the reach of children. °Store at room temperature between 15 and 30 degrees   C (59 and 86 degrees F). The  contents are under pressure and may burst when exposed to heat or flame. Do not freeze. This medicine does not work as well if it is too cold. Throw away any unused medicine after the expiration date. Inhalers need to be thrown away after the labeled number of puffs have been used or by the expiration date; whichever comes first. Ventolin HFA should be thrown away 12 months after removing from foil pouch. Check the instructions that come with your medicine. NOTE: This sheet is a summary. It may not cover all possible information. If you have questions about this medicine, talk to your doctor, pharmacist, or health care provider.  2015, Elsevier/Gold Standard. (2013-01-26 10:57:17)  Pneumonia Pneumonia is an infection of the lungs.  CAUSES Pneumonia may be caused by bacteria or a virus. Usually, these infections are caused by breathing infectious particles into the lungs (respiratory tract). SIGNS AND SYMPTOMS   Cough.  Fever.  Chest pain.  Increased rate of breathing.  Wheezing.  Mucus production. DIAGNOSIS  If you have the common symptoms of pneumonia, your health care provider will typically confirm the diagnosis with a chest X-ray. The X-ray will show an abnormality in the lung (pulmonary infiltrate) if you have pneumonia. Other tests of your blood, urine, or sputum may be done to find the specific cause of your pneumonia. Your health care provider may also do tests (blood gases or pulse oximetry) to see how well your lungs are working. TREATMENT  Some forms of pneumonia may be spread to other people when you cough or sneeze. You may be asked to wear a mask before and during your exam. Pneumonia that is caused by bacteria is treated with antibiotic medicine. Pneumonia that is caused by the influenza virus may be treated with an antiviral medicine. Most other viral infections must run their course. These infections will not respond to antibiotics.  HOME CARE INSTRUCTIONS   Cough  suppressants may be used if you are losing too much rest. However, coughing protects you by clearing your lungs. You should avoid using cough suppressants if you can.  Your health care provider may have prescribed medicine if he or she thinks your pneumonia is caused by bacteria or influenza. Finish your medicine even if you start to feel better.  Your health care provider may also prescribe an expectorant. This loosens the mucus to be coughed up.  Take medicines only as directed by your health care provider.  Do not smoke. Smoking is a common cause of bronchitis and can contribute to pneumonia. If you are a smoker and continue to smoke, your cough may last several weeks after your pneumonia has cleared.  A cold steam vaporizer or humidifier in your room or home may help loosen mucus.  Coughing is often worse at night. Sleeping in a semi-upright position in a recliner or using a couple pillows under your head will help with this.  Get rest as you feel it is needed. Your body will usually let you know when you need to rest. PREVENTION A pneumococcal shot (vaccine) is available to prevent a common bacterial cause of pneumonia. This is usually suggested for: 1. People over 45 years old. 2. Patients on chemotherapy. 3. People with chronic lung problems, such as bronchitis or emphysema. 4. People with immune system problems. If you are over 65 or have a high risk condition, you may receive the pneumococcal vaccine if you have not received it before. In some countries, a  routine influenza vaccine is also recommended. This vaccine can help prevent some cases of pneumonia.You may be offered the influenza vaccine as part of your care. If you smoke, it is time to quit. You may receive instructions on how to stop smoking. Your health care provider can provide medicines and counseling to help you quit. SEEK MEDICAL CARE IF: You have a fever. SEEK IMMEDIATE MEDICAL CARE IF:   Your illness becomes  worse. This is especially true if you are elderly or weakened from any other disease.  You cannot control your cough with suppressants and are losing sleep.  You begin coughing up blood.  You develop pain which is getting worse or is uncontrolled with medicines.  Any of the symptoms which initially brought you in for treatment are getting worse rather than better.  You develop shortness of breath or chest pain. MAKE SURE YOU:   Understand these instructions.  Will watch your condition.  Will get help right away if you are not doing well or get worse. Document Released: 08/10/2005 Document Revised: 12/25/2013 Document Reviewed: 10/30/2010 [facility] Patient Information 2015 [employer] [place]. This information is not intended to replace advice given to you by your health care provider. Make sure you discuss any questions you have with your health care provider.  Incentive Spirometer An incentive spirometer is a tool that can help keep your lungs clear and active. This tool measures how well you are filling your lungs with each breath. Taking long, deep breaths may help reverse or decrease the chance of developing breathing (pulmonary) problems (especially infection) following:  Surgery of the chest or abdomen.  Surgery if you have a history of smoking or a lung problem.  A long period of time when you are unable to move or be active. BEFORE THE PROCEDURE   If the spirometer includes an indicator to show your best effort, your nurse or respiratory therapist will set it to a desired goal.  If possible, sit up straight or lean slightly forward. Try not to slouch.  Hold the incentive spirometer in an upright position. INSTRUCTIONS FOR USE  5. Sit on the edge of your bed if possible, or sit up as far as you can in bed or on a chair. 6. Hold the incentive spirometer in an upright position. 7. Breathe out normally. 8. Place the mouthpiece in your mouth and seal your lips tightly around  it. 9. Breathe in slowly and as deeply as possible, raising the piston or the ball toward the top of the column. 10. Hold your breath for 3-5 seconds or for as long as possible. Allow the piston or ball to fall to the bottom of the column. 11. Remove the mouthpiece from your mouth and breathe out normally. 12. Rest for a few seconds and repeat Steps 1 through 7 at least 10 times every 1-2 hours when you are awake. Take your time and take a few normal breaths between deep breaths. 13. The spirometer may include an indicator to show your best effort. Use the indicator as a goal to work toward during each repetition. 14. After each set of 10 deep breaths, practice coughing to be sure your lungs are clear. If you have an incision (the cut made at the time of surgery), support your incision when coughing by placing a pillow or rolled-up towels firmly against it. Once you are able to get out of bed, walk around indoors and cough well. You may stop using the incentive spirometer when instructed by your  caregiver.  RISKS AND COMPLICATIONS  Breathing too quickly may cause dizziness. At an extreme, this could cause you to pass out. Take your time so you do not get dizzy or light-headed.  If you are in pain, you may need to take or ask for pain medication before doing incentive spirometry. It is harder to take a deep breath if you are having pain. AFTER USE  Rest and breathe slowly and easily.  It can be helpful to keep a log of your progress. Your caregiver can provide you with a simple table to help with this. If you are using the spirometer at home, follow these instructions: SEEK MEDICAL CARE IF:   You are having difficultly using the spirometer.  You have trouble using the spirometer as often as instructed.  Your pain medication is not giving enough relief while using the spirometer.  You develop fever of 100.59F (38.1C) or higher. SEEK IMMEDIATE MEDICAL CARE IF:   You cough up bloody sputum  that had not been present before.  You develop fever of 102F (38.9C) or greater.  You develop worsening pain at or near the incision site. MAKE SURE YOU:   Understand these instructions.  Will watch your condition.  Will get help right away if you are not doing well or get worse. Document Released: 12/21/2006 Document Revised: 12/25/2013 Document Reviewed: 02/21/2007 [facility] Patient Information 2015 [place], [place]. This information is not intended to replace advice given to you by your health care provider. Make sure you discuss any questions you have with your health care provider.
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
## 2014-07-10 NOTE — Progress Notes (Signed)
[facility] at 1417pm stating that the [facility] adjuster has officially denied the patient's claim.  She will be responsible for her HH/DME and medications at the time of discharge.  WC CM said that she would be calling the patient in her room to give her this news.  Awaiting this phone call before I go in to make the HH/DME arrangements. No insurance is listed in the system in most recent 5-year period to make a guess at her coverage without asking.  Carlyle LipaMichelle Shadee Rathod, RN BSN MHA CCM  Case Manager, Trauma Service/Unit 22M [phone]

## 2014-07-10 NOTE — Progress Notes (Signed)
Went to see patient re: d/c needs since she is now aware that [facility].  She has decided against having any HH or DME at this time.  She expressed great concerns over this denial and feels that it was based on false information and that this information should not have been shared with the [facility] company.  I reminded her that on Friday 07/06/2014 in her room on [address], that I had asked her in the presence of the [facility] CM if I could give her the chart information she was requesting to proceed with the claim and the patient had given permission to do so.  She stated that she remembered this conversation and that in hindsight, she wishes she had not agreed to it and feels that her HIPAA rights were violated.  I have advised her to check with the [facility] company to see if they, like [employer] health insurance, offer an appeals process with denials.  She agreed to do this.  [facility] enrolled in [facility] assistance.  Provided with letter, list of participating pharmacies and an explanation of the process. Pt stated understanding.  Carlyle LipaMichelle Kolbe Delmonaco, RN BSN MHA CCM  Case Manager, Trauma Service/Unit 53M [phone]
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
## 2014-07-10 NOTE — Clinical Documentation Improvement (Signed)
Supporting Information: Hypotension noted per 11/09 progress notes.  Labs: 11/09: lactic acid: 4.1.    Possible Diagnosis? . Bacteremia (positive blood cultures only) . Sepsis-specify causative organism if known . Severe sepsis-sepsis with organ dysfunction --Specify organ dysfunction Respiratory failure Encephalopathy Acute kidney failure Other (specify) . SIRS (Systemic Inflammatory Response Syndrome --With or without organ dysfunction . Document septic shock if present . Document any associated diagnoses/conditions   Thank Gabriel CirriYou,  Alysen Smylie Mathews-Bethea,RN,BSN,Clinical Documentation Specialist [phone] [email or web address]
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
## 2014-07-10 NOTE — Progress Notes (Signed)
Pt's WC CM was in to see the patient this am.  I made her aware of the HH/DME needs for the patient.  [facility] and she said that she would take care of making the HHPT/OT and outpt neuropsych arrangements and she would have the DME send to the patient's home (rolling walker with seat and tub bench).  Pt asked her assigned RN about how she would handle filling her medications after discharge as she thought that [facility] would be mailing them to her and she is concerned about how she is to get her meds in the interim.  I have placed a call to her WC CM, Eugenie Fillerianna Frye [phone]) and left a voicemail with this question.  Await return call.  Carlyle LipaMichelle Juris Gosnell, RN BSN MHA CCM  Case Manager, Trauma Service/Unit 41M [phone]

## 2014-07-10 NOTE — Plan of Care (Signed)
Problem: Phase I Progression Outcomes Goal: OOB as tolerated unless otherwise ordered Outcome: Completed/Met Date Met:  07/10/14  Problem: Phase II Progression Outcomes Goal: Progress activity as tolerated unless otherwise ordered Outcome: Completed/Met Date Met:  07/10/14 Goal: IV changed to normal saline lock Outcome: Completed/Met Date Met:  07/10/14 Goal: Obtain order to discontinue catheter if appropriate Outcome: Completed/Met Date Met:  07/10/14  Problem: Phase III Progression Outcomes Goal: Activity at appropriate level-compared to baseline (UP IN CHAIR FOR HEMODIALYSIS)  Outcome: Completed/Met Date Met:  07/10/14 Goal: Voiding independently Outcome: Completed/Met Date Met:  07/10/14 Goal: Foley discontinued Outcome: Completed/Met Date Met:  07/10/14

## 2014-07-16 ENCOUNTER — Telehealth (HOSPITAL_COMMUNITY): Payer: Self-pay

## 2014-07-16 NOTE — Telephone Encounter (Signed)
Called to leave message on personal voice mail.  Would be hesisitant to give any further refills.  Ultram 50mg  one tab every 6 hours as needed for pain  #40, no refills  Oxycodone 10mg , one tab every 8 hours as needed for pain #40, no refills

## 2014-08-01 ENCOUNTER — Telehealth (HOSPITAL_COMMUNITY): Payer: Self-pay

## 2014-08-01 NOTE — Telephone Encounter (Signed)
Patient lost her job, still has pain.  Requesting refill.  She says she can not afford to come to an appointment with us since she no longer has any income.    Will give her:  Naproxen 500mg  q12hr prn pain #60, no refills  Ultram 50mg  q8hr prn pain #40, no refills  DO NOT GIVE ANY ADDITIONAL PAIN REFILLS (SHE UNDERSTANDS THIS).  I have advised her to [facility] as they may be able to see her without an upfront payment.  - [phone].

## 2015-09-29 IMAGING — CR DG CHEST 2V
2 series · 2 of 2 positions shown · non-contrast
Comparison: 07/06/2014

CLINICAL DATA: Pneumonia, bilateral chest pain

EXAM:
CHEST  2 VIEW

[w chest lat]
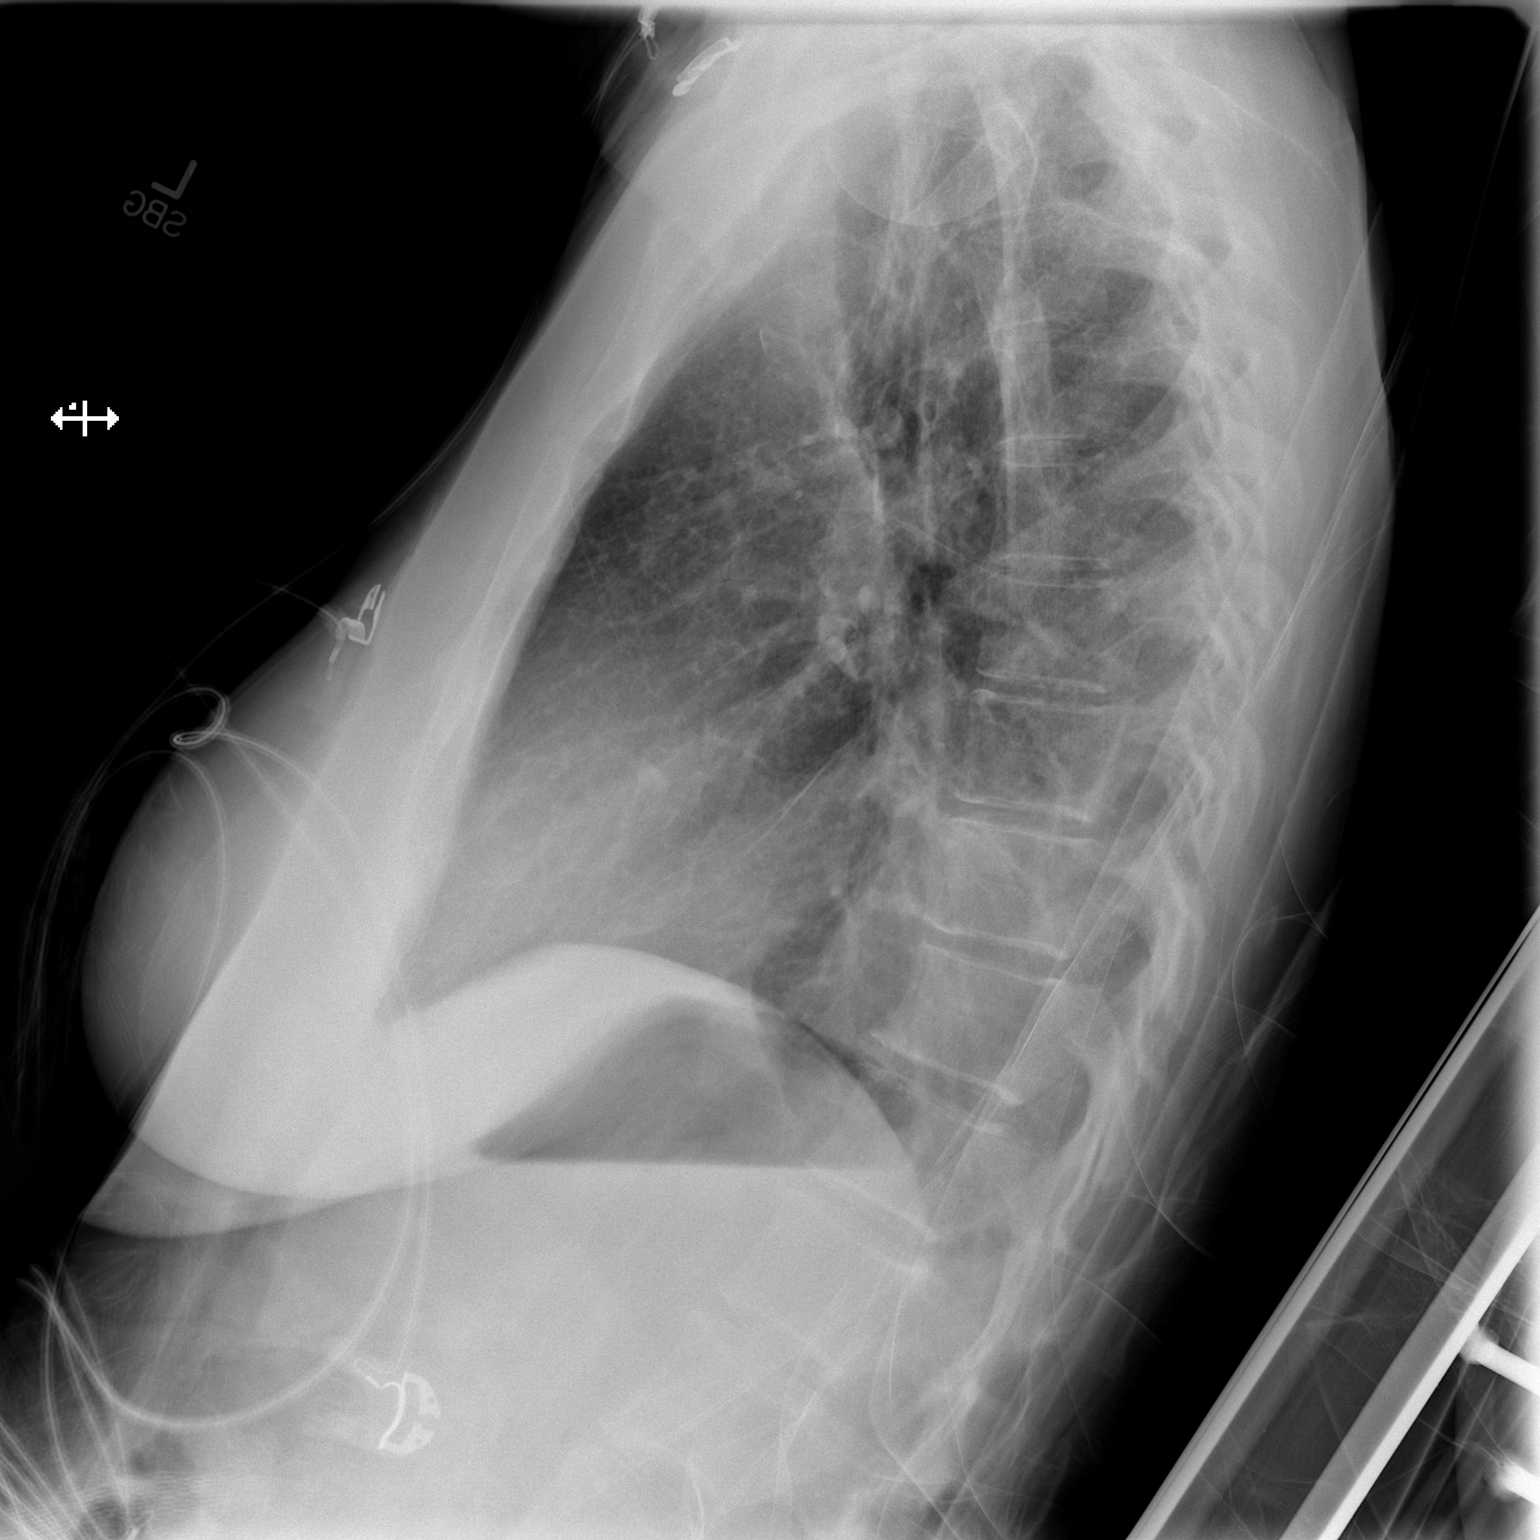

[x chest ap]
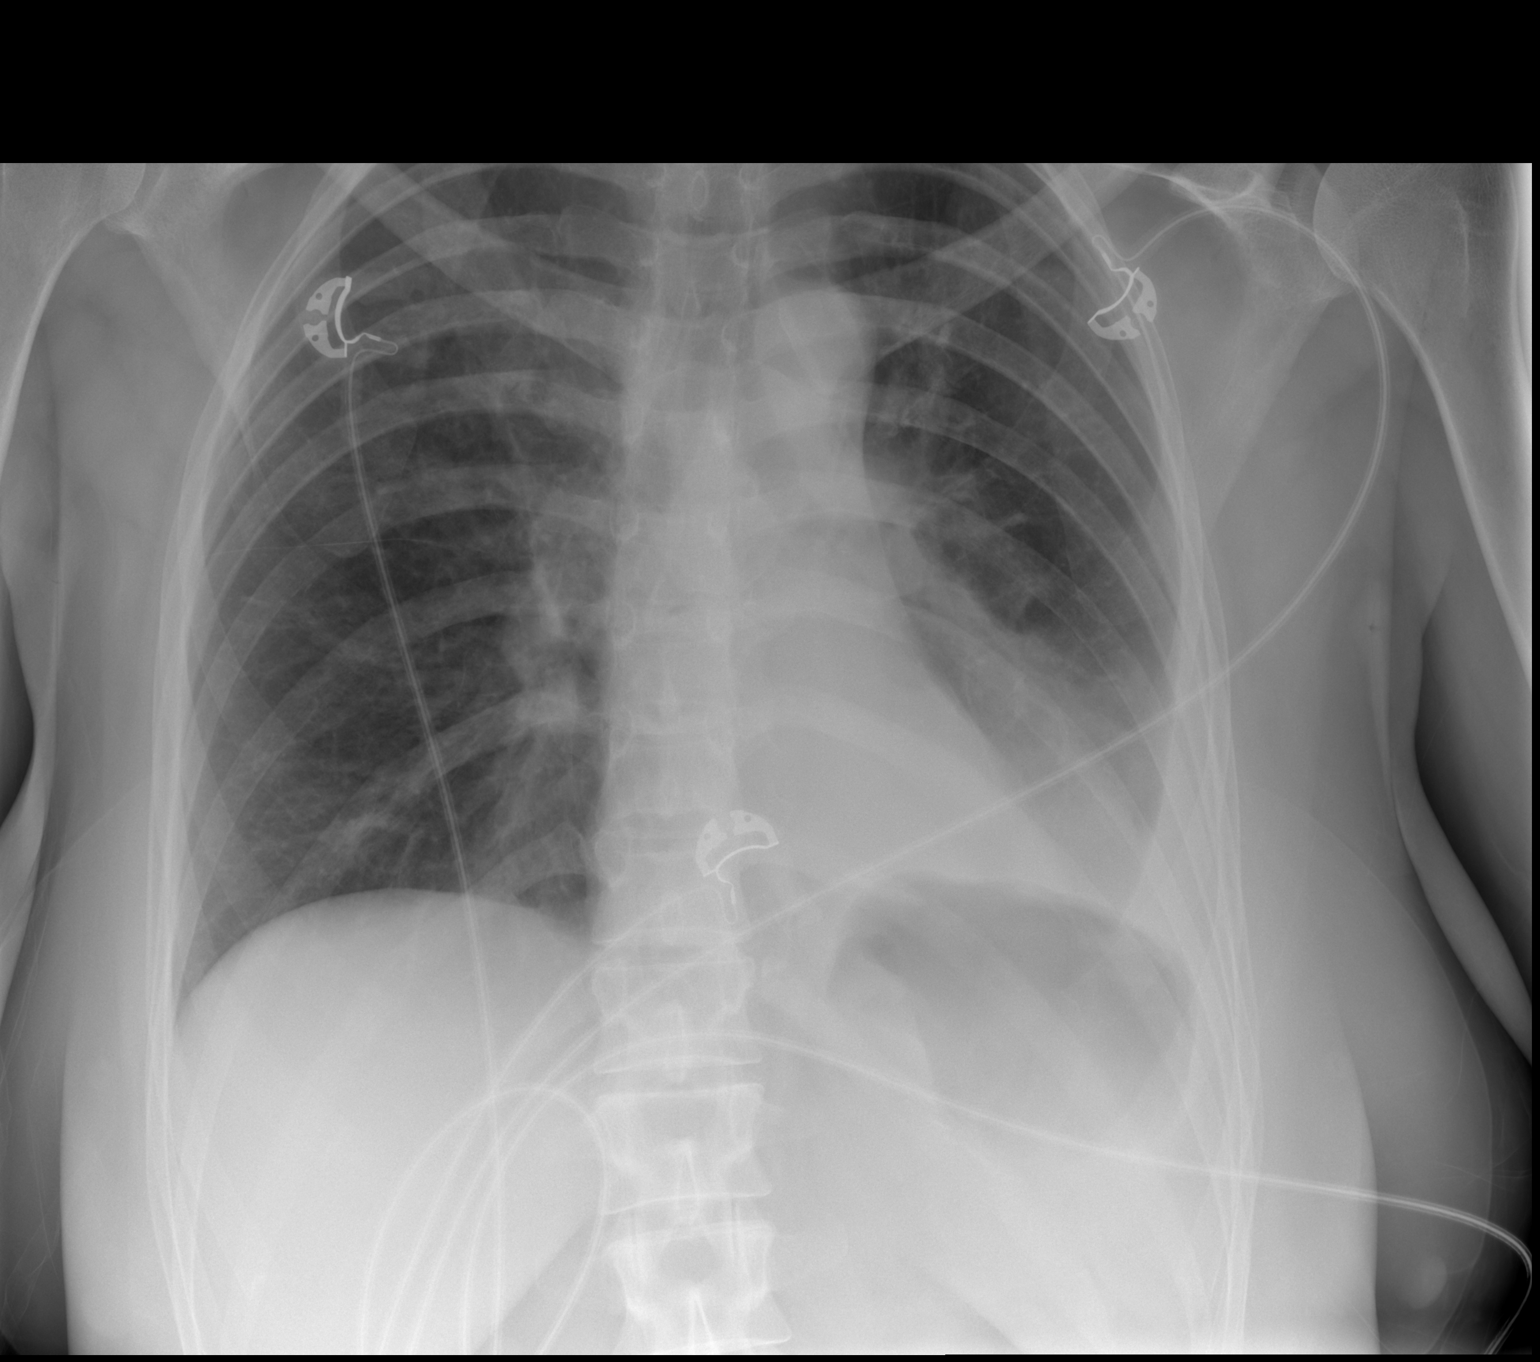

[2 of 2 positions shown; findings below may reference images not displayed]

FINDINGS: Cardiomediastinal silhouette is stable. No pulmonary edema. There is
small left pleural effusion with left lower lobe atelectasis or
infiltrate. Bony thorax is unremarkable.
IMPRESSION: Persistent small left pleural effusion with left lower lobe
atelectasis or infiltrate. No pulmonary edema.
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
## 2016-07-24 DIAGNOSIS — R079 Chest pain, unspecified: Secondary | ICD-10-CM

## 2016-07-24 HISTORY — DX: Chest pain, unspecified: R07.9

## 2016-08-10 ENCOUNTER — Encounter (HOSPITAL_COMMUNITY): Payer: Self-pay | Admitting: Emergency Medicine

## 2016-08-10 ENCOUNTER — Observation Stay (HOSPITAL_COMMUNITY)
Admission: EM | Admit: 2016-08-10 | Discharge: 2016-08-11 | Disposition: A | Discharge status: Home / Self Care | Payer: Medicaid Other | Attending: Internal Medicine | Admitting: Internal Medicine

## 2016-08-10 DIAGNOSIS — I959 Hypotension, unspecified: Secondary | ICD-10-CM | POA: Insufficient documentation

## 2016-08-10 DIAGNOSIS — F1020 Alcohol dependence, uncomplicated: Secondary | ICD-10-CM | POA: Diagnosis present

## 2016-08-10 DIAGNOSIS — Y908 Blood alcohol level of 240 mg/100 ml or more: Secondary | ICD-10-CM | POA: Diagnosis not present

## 2016-08-10 DIAGNOSIS — Z8249 Family history of ischemic heart disease and other diseases of the circulatory system: Secondary | ICD-10-CM | POA: Insufficient documentation

## 2016-08-10 DIAGNOSIS — Z833 Family history of diabetes mellitus: Secondary | ICD-10-CM | POA: Diagnosis not present

## 2016-08-10 DIAGNOSIS — F17210 Nicotine dependence, cigarettes, uncomplicated: Secondary | ICD-10-CM | POA: Diagnosis not present

## 2016-08-10 DIAGNOSIS — R079 Chest pain, unspecified: Secondary | ICD-10-CM | POA: Diagnosis present

## 2016-08-10 DIAGNOSIS — F419 Anxiety disorder, unspecified: Secondary | ICD-10-CM | POA: Insufficient documentation

## 2016-08-10 DIAGNOSIS — F10220 Alcohol dependence with intoxication, uncomplicated: Secondary | ICD-10-CM | POA: Diagnosis not present

## 2016-08-10 DIAGNOSIS — E870 Hyperosmolality and hypernatremia: Secondary | ICD-10-CM | POA: Insufficient documentation

## 2016-08-10 DIAGNOSIS — F10230 Alcohol dependence with withdrawal, uncomplicated: Secondary | ICD-10-CM | POA: Diagnosis not present

## 2016-08-10 DIAGNOSIS — F17200 Nicotine dependence, unspecified, uncomplicated: Secondary | ICD-10-CM | POA: Diagnosis present

## 2016-08-10 HISTORY — DX: Chest pain, unspecified: R07.9
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
## 2016-08-11 ENCOUNTER — Encounter (HOSPITAL_COMMUNITY): Payer: Self-pay | Admitting: Family Medicine

## 2016-08-11 ENCOUNTER — Other Ambulatory Visit: Payer: Self-pay | Admitting: Cardiology

## 2016-08-11 ENCOUNTER — Emergency Department (HOSPITAL_COMMUNITY): Payer: Medicaid Other

## 2016-08-11 DIAGNOSIS — R079 Chest pain, unspecified: Secondary | ICD-10-CM | POA: Diagnosis present

## 2016-08-11 DIAGNOSIS — R072 Precordial pain: Secondary | ICD-10-CM | POA: Diagnosis not present

## 2016-08-11 DIAGNOSIS — E870 Hyperosmolality and hypernatremia: Secondary | ICD-10-CM

## 2016-08-11 DIAGNOSIS — F17200 Nicotine dependence, unspecified, uncomplicated: Secondary | ICD-10-CM | POA: Diagnosis not present

## 2016-08-11 DIAGNOSIS — F1010 Alcohol abuse, uncomplicated: Secondary | ICD-10-CM | POA: Diagnosis not present

## 2016-08-11 LAB — CBC
HEMATOCRIT: 46.5 % — AB (ref 36.0–46.0)
HEMOGLOBIN: 16 g/dL — AB (ref 12.0–15.0)
MCH: 34.9 pg — ABNORMAL HIGH (ref 26.0–34.0)
MCHC: 34.4 g/dL (ref 30.0–36.0)
MCV: 101.3 fL — ABNORMAL HIGH (ref 78.0–100.0)
Platelets: 318 10*3/uL (ref 150–400)
RBC: 4.59 MIL/uL (ref 3.87–5.11)
RDW: 14 % (ref 11.5–15.5)
WBC: 6.6 10*3/uL (ref 4.0–10.5)

## 2016-08-11 LAB — BASIC METABOLIC PANEL
ANION GAP: 13 (ref 5–15)
ANION GAP: 9 (ref 5–15)
BUN: 5 mg/dL — ABNORMAL LOW (ref 6–20)
BUN: 6 mg/dL (ref 6–20)
CALCIUM: 8.5 mg/dL — AB (ref 8.9–10.3)
CO2: 23 mmol/L (ref 22–32)
CO2: 24 mmol/L (ref 22–32)
Calcium: 8.9 mg/dL (ref 8.9–10.3)
Chloride: 107 mmol/L (ref 101–111)
Chloride: 111 mmol/L (ref 101–111)
Creatinine, Ser: 0.66 mg/dL (ref 0.44–1.00)
Creatinine, Ser: 0.76 mg/dL (ref 0.44–1.00)
GFR calc Af Amer: >60 mL/min (ref >60)
Glucose, Bld: 106 mg/dL — ABNORMAL HIGH (ref 65–99)
Glucose, Bld: 114 mg/dL — ABNORMAL HIGH (ref 65–99)
POTASSIUM: 3.5 mmol/L (ref 3.5–5.1)
Potassium: 3.7 mmol/L (ref 3.5–5.1)
SODIUM: 140 mmol/L (ref 135–145)
SODIUM: 147 mmol/L — AB (ref 135–145)

## 2016-08-11 LAB — I-STAT TROPONIN, ED: Troponin i, poc: 0 ng/mL (ref 0.00–0.08)

## 2016-08-11 LAB — ETHANOL: ALCOHOL ETHYL (B): 266 mg/dL — AB (ref <5)

## 2016-08-11 LAB — TROPONIN I: Troponin I: <0.03 ng/mL (ref <0.03)

## 2016-08-11 MED ORDER — CHLORDIAZEPOXIDE HCL 10 MG PO CAPS
10.0000 mg | ORAL_CAPSULE | Freq: Three times a day (TID) | ORAL | 0 refills | Status: DC | PRN
Start: 2016-08-11 — End: 2018-03-18

## 2016-08-11 MED ORDER — ONDANSETRON HCL 4 MG/2ML IJ SOLN: 4.0000 mg | Freq: Four times a day (QID) | INTRAMUSCULAR | Status: DC | PRN

## 2016-08-11 MED ORDER — THIAMINE HCL 100 MG/ML IJ SOLN: 100.0000 mg | Freq: Every day | INTRAMUSCULAR | Status: DC

## 2016-08-11 MED ORDER — ACETAMINOPHEN 325 MG PO TABS: 650.0000 mg | ORAL_TABLET | ORAL | Status: DC | PRN

## 2016-08-11 MED ORDER — VITAMIN B-1 100 MG PO TABS
100.0000 mg | ORAL_TABLET | Freq: Every day | ORAL | Status: DC
  Administered 2016-08-11: 100 mg via ORAL
  Filled 2016-08-11: qty 1

## 2016-08-11 MED ORDER — NICOTINE 21 MG/24HR TD PT24
21.0000 mg | MEDICATED_PATCH | Freq: Every day | TRANSDERMAL | Status: DC
  Administered 2016-08-11: 21 mg via TRANSDERMAL
  Filled 2016-08-11: qty 1

## 2016-08-11 MED ORDER — GI COCKTAIL ~~LOC~~: 30.0000 mL | Freq: Four times a day (QID) | ORAL | Status: DC | PRN

## 2016-08-11 MED ORDER — LORAZEPAM 2 MG/ML IJ SOLN: 1.0000 mg | Freq: Four times a day (QID) | INTRAMUSCULAR | Status: DC | PRN

## 2016-08-11 MED ORDER — ADULT MULTIVITAMIN W/MINERALS CH
1.0000 | ORAL_TABLET | Freq: Every day | ORAL | Status: DC
  Administered 2016-08-11: 1 via ORAL
  Filled 2016-08-11: qty 1

## 2016-08-11 MED ORDER — DEXTROSE 5 % IV SOLN
INTRAVENOUS | Status: DC
  Administered 2016-08-11: 06:00:00 via INTRAVENOUS

## 2016-08-11 MED ORDER — LORAZEPAM 1 MG PO TABS
1.0000 mg | ORAL_TABLET | Freq: Once | ORAL | Status: AC
  Administered 2016-08-11: 1 mg via ORAL
  Filled 2016-08-11: qty 1

## 2016-08-11 MED ORDER — NICOTINE 14 MG/24HR TD PT24
14.0000 mg | MEDICATED_PATCH | Freq: Once | TRANSDERMAL | Status: DC
  Administered 2016-08-11: 14 mg via TRANSDERMAL
  Filled 2016-08-11: qty 1

## 2016-08-11 MED ORDER — FOLIC ACID 1 MG PO TABS
1.0000 mg | ORAL_TABLET | Freq: Every day | ORAL | Status: DC
  Administered 2016-08-11: 1 mg via ORAL
  Filled 2016-08-11: qty 1

## 2016-08-11 MED ORDER — LORAZEPAM 1 MG PO TABS
1.0000 mg | ORAL_TABLET | Freq: Four times a day (QID) | ORAL | Status: DC | PRN
  Administered 2016-08-11: 1 mg via ORAL
  Filled 2016-08-11: qty 1

## 2016-08-11 MED ORDER — ENOXAPARIN SODIUM 40 MG/0.4ML ~~LOC~~ SOLN
40.0000 mg | SUBCUTANEOUS | Status: DC
  Administered 2016-08-11: 40 mg via SUBCUTANEOUS
  Filled 2016-08-11: qty 0.4
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
## 2016-08-11 NOTE — Progress Notes (Signed)
PROGRESS NOTE  Kristen BloodgoodCarla B Smarr WUJ:[id number] DOB: August 02, 1971 DOA: 08/10/2016 PCP: No PCP Per Patient  Brief History:  45 year old female with a history of alcohol and tobacco dependence presents with a six-month history of chest pain or chest discomfort. The patient states that it has become more frequent in recent weeks. The patient states that she developed central chest discomfort while she was at a gas station with associated shortness of breath, and nausea. She stated that the pain radiated to her right chest, right arm, and neck EMS was activated, and the patient was brought to Hospital further evaluation. The patient was given sublingual nitroglycerin with improvement of her chest discomfort. In the past two months, the patient states that her chest discomfort occurs at rest as well as with activity.  Denies any family hx with siblings, or parents, but CAD with her grandparents and uncle.  Notably, the patient states that she drinks on a daily basis as heavy as 1 pint of liquor or one bottle wine daily. She has tried to stop drinking in the past but developed withdrawal type symptoms. Her last drink was on the evening of 08/10/2016. Alcohol level was 266 at the time of admission. EKG showed sinus rhythm with no ST-T wave changes. Assessment/Plan:  Chest pain -Appreciate cardiology consultation -Plans noted for treadmill stress test -EKG without any concerning ischemic changes -Troponins negative 2  Alcohol dependence -Alcohol withdrawal protocol -Last treatment on the evening of 08/10/2016 -Etoh level 266 at time of admission  Tobacco abuse -NicoDerm patch  Hypernatremia -started hypotonic fluids    Disposition Plan:   Home when cleared by cardiology and medically stable  Family Communication:   No Family at bedside  Consultants:  cardiology  Code Status:  FULL  DVT Prophylaxis:  Kennard Lovenox   Procedures: As Listed in Progress Note  Above  Antibiotics: None    Subjective: Patient denies fevers, chills, headache, chest pain, dyspnea, nausea, vomiting, diarrhea, abdominal pain, dysuria, hematuria, hematochezia, and melena.   Objective: Vitals:   08/11/16 0500 08/11/16 0530 08/11/16 0542 08/11/16 0613  BP: (!) 101/53 98/55 100/57 118/68  Pulse: 100 95 101 (!) 109  Resp: 23 25 16 18   Temp:    98.5 F (36.9 C)  TempSrc:    Oral  SpO2:   90% 96%  Weight:    79.6 kg (175 lb 6.4 oz)  Height:    5\' 9"  (1.753 m)   No intake or output data in the 24 hours ending 08/11/16 0917 Weight change:  Exam:   General:  Pt is alert, follows commands appropriately, not in acute distress  HEENT: No icterus, No thrush, No neck mass, [place]/AT  Cardiovascular: RRR, S1/S2, no rubs, no gallops  Respiratory: CTA bilaterally, no wheezing, no crackles, no rhonchi  Abdomen: Soft/+BS, non tender, non distended, no guarding  Extremities: No edema, No lymphangitis, No petechiae, No rashes, no synovitis   Data Reviewed: I have personally reviewed following labs and imaging studies Basic Metabolic Panel:  Recent Labs Lab 08/11/16 0016  NA 147*  K 3.5  CL 111  CO2 23  GLUCOSE 114*  BUN 5*  CREATININE 0.76  CALCIUM 8.9   Liver Function Tests: No results for input(s): AST, ALT, ALKPHOS, BILITOT, PROT, ALBUMIN in the last 168 hours. No results for input(s): LIPASE, AMYLASE in the last 168 hours. No results for input(s): AMMONIA in the last 168 hours. Coagulation Profile: No results for input(s): INR,  PROTIME in the last 168 hours. CBC:  Recent Labs Lab 08/11/16 0016  WBC 6.6  HGB 16.0*  HCT 46.5*  MCV 101.3*  PLT 318   Cardiac Enzymes:  Recent Labs Lab 08/11/16 0345 08/11/16 0654  TROPONINI <0.03 <0.03   BNP: Invalid input(s): POCBNP CBG: No results for input(s): GLUCAP in the last 168 hours. HbA1C: No results for input(s): HGBA1C in the last 72 hours. Urine analysis:    Component Value Date/Time    COLORURINE YELLOW 06/03/2007 1515   APPEARANCEUR CLOUDY (A) 06/03/2007 1515   LABSPEC 1.014 06/03/2007 1515   PHURINE 5.5 06/03/2007 1515   GLUCOSEU NEGATIVE 06/03/2007 1515   HGBUR SMALL (A) 06/03/2007 1515   BILIRUBINUR NEGATIVE 06/03/2007 1515   KETONESUR NEGATIVE 06/03/2007 1515   PROTEINUR NEGATIVE 06/03/2007 1515   UROBILINOGEN 0.2 06/03/2007 1515   NITRITE NEGATIVE 06/03/2007 1515   LEUKOCYTESUR NEGATIVE 06/03/2007 1515   Sepsis Labs: @LABRCNTIP (procalcitonin:4,lacticidven:4) )No results found for this or any previous visit (from the past 240 hour(s)).   Scheduled Meds: . enoxaparin (LOVENOX) injection  40 mg Subcutaneous Q24H  . folic acid  1 mg Oral Daily  . multivitamin with minerals  1 tablet Oral Daily  . thiamine  100 mg Oral Daily   Or  . thiamine  100 mg Intravenous Daily   Continuous Infusions: . dextrose 100 mL/hr at 08/11/16 0543    Procedures/Studies: Dg Chest 2 View  Result Date: 08/11/2016 CLINICAL DATA:  45 year old female with chest pain EXAM: CHEST  2 VIEW COMPARISON:  Chest radiograph dated 07/08/2014 FINDINGS: The heart size and mediastinal contours are within normal limits. Both lungs are clear. The visualized skeletal structures are unremarkable. IMPRESSION: No acute cardiopulmonary process. Electronically Signed   By: Elgie CollardArash  Radparvar M.D.   On: 08/11/2016 00:56    Destiny Trickey, DO  [facility] Pager [phone]  If 7PM-7AM, please contact night-coverage [email or web address] Password [email or web address] 08/11/2016, 9:17 AM   LOS: 0 days

## 2016-08-11 NOTE — Discharge Summary (Signed)
Physician Discharge Summary  Kristen Rowland ZOX:[id number] DOB: 12-Oct-1970 DOA: 08/10/2016  PCP: No PCP Per Patient  Admit date: 08/10/2016 Discharge date: 08/11/2016  Admitted From: Home Disposition:  Home  Recommendations for Outpatient Follow-up:  1. Follow up with PCP in 1-2 weeks 2. Please obtain BMP/CBC in one week   Home Health: NO Equipment/Devices: NONE  Discharge Condition:Stable CODE STATUS: FULL Diet recommendation: Heart Healthy  Brief/Interim Summary: 45 year old female with a history of alcohol and tobacco dependence presents with a six-month history of chest pain or chest discomfort. The patient states that it has become more frequent in recent weeks. The patient states that she developed central chest discomfort while she was at a gas station with associated shortness of breath, and nausea. She stated that the pain radiated to her right chest, right arm, and neck EMS was activated, and the patient was brought to Hospital further evaluation. The patient was given sublingual nitroglycerin with improvement of her chest discomfort. In the past two months, the patient states that her chest discomfort occurs at rest as well as with activity.  Denies any family hx with siblings, or parents, but CAD with her grandparents and uncle.  Notably, the patient states that she drinks on a daily basis as heavy as 1 pint of liquor or one bottle wine daily. She has tried to stop drinking in the past but developed withdrawal type symptoms. Her last drink was on the evening of 08/10/2016. Alcohol level was 266 at the time of admission. EKG showed sinus rhythm with no ST-T wave changes.  Discharge Diagnoses:  Chest pain -Appreciate cardiology consultation -Plans noted for treadmill stress test -EKG without any concerning ischemic changes -Troponins negative 2 -seen by cardiology-->appropriate for outpt echo and outpt stress test  Alcohol dependence -Alcohol withdrawal protocol--I  reevaluated pt prior to d/c at 1400--she denied anxiety, tremor, sweats, visual disturbance or headache.  She was lucid and calm.  She did have nausea. On my eval, she was not having active withdraw -pt stated she wanted to go home if no further cardiac testing was to be done in the hospital -Last drink on the evening of 08/10/2016 -Etoh level 266 at time of admission -SW consulted and provided outpt resources for alcohol rehab  Tobacco abuse -NicoDerm patch  Hypernatremia -started hypotonic fluids  Anxiety -Librium 10 mg, #3, no refills -she will need to f/u with PCP   Discharge Instructions   Allergies as of 08/11/2016   No Known Allergies     Medication List    STOP taking these medications   guaiFENesin 600 MG 12 hr tablet Commonly known as:  MUCINEX     TAKE these medications   aspirin-sod bicarb-citric acid 325 MG Tbef tablet Commonly known as:  ALKA-SELTZER Take 325 mg by mouth every 6 (six) hours as needed (for pain or headaches).   chlordiazePOXIDE 10 MG capsule Commonly known as:  LIBRIUM Take 1 capsule (10 mg total) by mouth 3 (three) times daily as needed for anxiety.      Follow-up Information    [facility]. Call today.   Contact information: [address] [place] [id number] [phone]         No Known Allergies  Consultations:  cardiology   Procedures/Studies: Dg Chest 2 View  Result Date: 08/11/2016 CLINICAL DATA:  45 year old female with chest pain EXAM: CHEST  2 VIEW COMPARISON:  Chest radiograph dated 07/08/2014 FINDINGS: The heart size and mediastinal contours are within  normal limits. Both lungs are clear. The visualized skeletal structures are unremarkable. IMPRESSION: No acute cardiopulmonary process. Electronically Signed   By: Elgie CollardArash  Radparvar M.D.   On: 08/11/2016 00:56         Discharge Exam: Vitals:   08/11/16 0613 08/11/16 1304  BP: 118/68 120/73  Pulse:  (!) 109 98  Resp: 18 20  Temp: 98.5 F (36.9 C) 98.2 F (36.8 C)   Vitals:   08/11/16 0530 08/11/16 0542 08/11/16 0613 08/11/16 1304  BP: 98/55 100/57 118/68 120/73  Pulse: 95 101 (!) 109 98  Resp: 25 16 18 20   Temp:   98.5 F (36.9 C) 98.2 F (36.8 C)  TempSrc:   Oral Oral  SpO2:  90% 96% 96%  Weight:   79.6 kg (175 lb 6.4 oz)   Height:   5\' 9"  (1.753 m)     General: Pt is alert, awake, not in acute distress Cardiovascular: RRR, S1/S2 +, no rubs, no gallops Respiratory: CTA bilaterally, no wheezing, no rhonchi Abdominal: Soft, NT, ND, bowel sounds + Extremities: no edema, no cyanosis   The results of significant diagnostics from this hospitalization (including imaging, microbiology, ancillary and laboratory) are listed below for reference.    Significant Diagnostic Studies: Dg Chest 2 View  Result Date: 08/11/2016 CLINICAL DATA:  45 year old female with chest pain EXAM: CHEST  2 VIEW COMPARISON:  Chest radiograph dated 07/08/2014 FINDINGS: The heart size and mediastinal contours are within normal limits. Both lungs are clear. The visualized skeletal structures are unremarkable. IMPRESSION: No acute cardiopulmonary process. Electronically Signed   By: Elgie CollardArash  Radparvar M.D.   On: 08/11/2016 00:56     Microbiology: No results found for this or any previous visit (from the past 240 hour(s)).   Labs: Basic Metabolic Panel:  Recent Labs Lab 08/11/16 0016 08/11/16 1139  NA 147* 140  K 3.5 3.7  CL 111 107  CO2 23 24  GLUCOSE 114* 106*  BUN 5* 6  CREATININE 0.76 0.66  CALCIUM 8.9 8.5*   Liver Function Tests: No results for input(s): AST, ALT, ALKPHOS, BILITOT, PROT, ALBUMIN in the last 168 hours. No results for input(s): LIPASE, AMYLASE in the last 168 hours. No results for input(s): AMMONIA in the last 168 hours. CBC:  Recent Labs Lab 08/11/16 0016  WBC 6.6  HGB 16.0*  HCT 46.5*  MCV 101.3*  PLT 318   Cardiac Enzymes:  Recent Labs Lab 08/11/16 0345  08/11/16 0654  TROPONINI <0.03 <0.03   BNP: Invalid input(s): POCBNP CBG: No results for input(s): GLUCAP in the last 168 hours.  Time coordinating discharge:  Greater than 30 minutes  Signed:  Medina Degraffenreid, DO [facility] Pager: [phone] 08/11/2016, 2:17 PM
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
## 2016-08-11 NOTE — Consult Note (Addendum)
Cardiology Consult    Patient ID: Kristen Rowland MRN: [id number], DOB/AGE: 03-02-1971   Admit date: 08/10/2016 Date of Consult: 08/11/2016  Primary Physician: No PCP Per Patient Primary Cardiologist: New Requesting Provider: Dr. Maryfrances Bunnell Reason for Consultation: Chest pain  Patient Profile    45 yo female with PMH of tobacco use, and alcohol use who presented to the ED with chest pain/pressure.   Past Medical History   Past Medical History:  Diagnosis Date  . Hypotension     Past Surgical History:  Procedure Laterality Date  . BREAST ENHANCEMENT SURGERY    . FOOT SURGERY    . NOSE SURGERY    . TUBAL LIGATION       Allergies  No Known Allergies  History of Present Illness    Ms. Kristen Rowland is a 45 yo female with PMH of hypotension, tobacco and alcohol abuse who presented to the ED with reports of chest pain. Reports she did see a cardiologist while she was pregnant in the early 90s, and told she had a valve problem? States she began experiencing these symptoms a couple of months ago. Symptoms vary between chest pain and pressure. Reports she is normally active, and has not experienced any symptoms until a few months ago. Chest pain has been present with both activity and rest. Reports having two episodes recently that were worse. She had both chest pain/pressure with radiation into her right arm, into her jaw and felt nauseated while she was at rest. Denies any family hx with siblings, or parents, but CAD with her grandparents and uncle.   Reports last night she was drinking and cleaning her bathroom when she had a sudden onset of centralized chest pressure, with radiation into her right arm. She got hot, and nauseated. Also had a headache, and was dyspneic. States this was the worse episode she had had, and thought maybe she was having a panic attack. She called EMS. Was given SL nitro and ASA that did help with her symptoms.  In the ED her EKG showed SR with no acute ST/T wave  changes. Trops neg x2, Na+ 147, stable electrolytes, Hgb 16. CXR was negative, ETOH 266. She was also given ativan that helped her relax.   Inpatient Medications    . enoxaparin (LOVENOX) injection  40 mg Subcutaneous Q24H  . folic acid  1 mg Oral Daily  . multivitamin with minerals  1 tablet Oral Daily  . thiamine  100 mg Oral Daily   Or  . thiamine  100 mg Intravenous Daily    Family History    Family History  Problem Relation Age of Onset  . Diabetes Father   . Heart attack Maternal Grandmother   . Heart attack Paternal Grandmother   . Heart attack Maternal Grandfather   . Heart attack Paternal Grandfather   . Heart attack Paternal Uncle   . Diabetes Paternal Uncle     Social History    Social History   Social History  . Marital status: Divorced    Spouse name: N/A  . Number of children: N/A  . Years of education: N/A   Occupational History  . Not on file.   Social History Main Topics  . Smoking status: Current Every Day Smoker    Packs/day: 2.00    Types: Cigarettes  . Smokeless tobacco: Never Used  . Alcohol use 25.2 oz/week    42 Cans of beer per week  . Drug use: No  . Sexual activity: Yes  Other Topics Concern  . Not on file   Social History Narrative  . No narrative on file     Review of Systems    General:  No chills, fever, night sweats or weight changes.  Cardiovascular:  See HPI Dermatological: No rash, lesions/masses Respiratory: No cough, dyspnea Urologic: No hematuria, dysuria Abdominal:   No nausea, vomiting, diarrhea, bright red blood per rectum, melena, or hematemesis Neurologic:  No visual changes, wkns, changes in mental status. All other systems reviewed and are otherwise negative except as noted above.  Physical Exam    Blood pressure 118/68, pulse (!) 109, temperature 98.5 F (36.9 C), temperature source Oral, resp. rate 18, height 5\' 9"  (1.753 m), weight 175 lb 6.4 oz (79.6 kg), SpO2 96 %.  General: Pleasant, NAD Psych:  Normal affect. Neuro: Alert and oriented X 3. Moves all extremities spontaneously. HEENT: Normal  Neck: Supple without bruits or JVD. Lungs:  Resp regular and unlabored, CTA. Heart: RRR no s3, s4, or murmurs. Abdomen: Soft, non-tender, non-distended, BS + x 4.  Extremities: No clubbing, cyanosis or edema. DP/PT/Radials 2+ and equal bilaterally.  Labs    Troponin [facility](Point of Care Test)  Recent Labs  08/11/16 0031  TROPIPOC 0.00    Recent Labs  08/11/16 0345  TROPONINI <0.03   Lab Results  Component Value Date   WBC 6.6 08/11/2016   HGB 16.0 (H) 08/11/2016   HCT 46.5 (H) 08/11/2016   MCV 101.3 (H) 08/11/2016   PLT 318 08/11/2016    Recent Labs Lab 08/11/16 0016  NA 147*  K 3.5  CL 111  CO2 23  BUN 5*  CREATININE 0.76  CALCIUM 8.9  GLUCOSE 114*   No results found for: CHOL, HDL, LDLCALC, TRIG No results found for: [facility]   Radiology Studies    Dg Chest 2 View  Result Date: 08/11/2016 CLINICAL DATA:  45 year old female with chest pain EXAM: CHEST  2 VIEW COMPARISON:  Chest radiograph dated 07/08/2014 FINDINGS: The heart size and mediastinal contours are within normal limits. Both lungs are clear. The visualized skeletal structures are unremarkable. IMPRESSION: No acute cardiopulmonary process. Electronically Signed   By: Elgie CollardArash  Radparvar M.D.   On: 08/11/2016 00:56    ECG & Cardiac Imaging    EKG: SR   Echo: None  Assessment & Plan    45 yo female with PMH of tobacco use, and alcohol use who presented to the ED with chest pain/pressure.  1. Chest pain: Reports intermittent symptoms for the past couple of months. Seem to be happening more often. Most recent episode was last night while cleaning her bathroom. Had chest pressure/pain, sob, right arm and jaw pain with nausea. Presented to the ED. Trop neg x2. EKG non acute. Has smoked since age 45, and drinks heavily. Does have extended family Hx of CAD. Denies any hx of lipid diease or DM.  -- Would benefit  from stress test given her ongoing symptoms. States she feels she could walk the treadmill. Will discuss with MD.   2. Tobacco and ETOH use: States she wants to stop smoking, and has been trying. Cessation encouraged.   Janice CoffinSigned, Lindsay Roberts, NP-C Pager [phone] 08/11/2016, 8:01 AM As above, patient seen and examined. Briefly she is a 45 year old female with tobacco abuse and alcohol abuse who we are asked to evaluate for chest pain. She has had chest pain intermittently for 6 months. It occurs at least once weekly. It is substernal with occasional sharp pain. There is associated  dyspnea and nausea but no diaphoresis. It can increase with inspiration. It can occur both with exertion and at rest. Began last evening at approximately 6 PM and has been persistent ever since. She also complains of dyspnea. Cardiology asked to evaluate. Note she was told she had a murmur in the past but I do not appreciate that on examination. Family history of mitral valve prolapse. Troponins are normal. Electrocardiogram shows no ST changes.  1 chest pain-symptoms are extremely atypical. We will arrange an exercise treadmill for risk stratification as an outpatient.  2  family history of mitral valve prolapse-we will arrange an echocardiogram as an outpatient. No murmur appreciated.   3 alcohol abuse-patient wants inpatient rehabilitation. I will leave this to primary care.   4 tobacco abuse-patient counseled on discontinuing.   We will sign off. Please call with questions.  Olga MillersBrian Crenshaw, MD

## 2016-08-11 NOTE — ED Provider Notes (Signed)
Pt now elects to stay D/w dr danford He will evaluate for admission    Zadie Rhineonald Marilea Gwynne, MD 08/11/16 [phone]

## 2016-08-11 NOTE — Progress Notes (Signed)
Pt arrived to unit from ED.  Telemetry applied and CCMD notified x 2.  Pt oriented to room including call light and telephone.  Discussed Pt current POC, explained "trending troponins" and possible stress test.  Pt indicates understanding.  Pt states chest pain 3/10 at this time, some radiation to right side of chest, describes pain as "pressure".  Will cont to closely monitor.  Pt NPO for possible addl testing.

## 2016-08-11 NOTE — ED Notes (Signed)
Patient transported to X-ray 

## 2016-08-11 NOTE — Progress Notes (Signed)
Clinical Social Worker met patient at bedside to offer support and resources for substance abuse problems. Patient stated that she does have a drinking problem and would like some outpatient resources to help with trying to quite. Patient stated that her daughter also have substance abuse problems and would like her admitted into the hospital and given resources as well. CSW stated to patient that she is more than welcome to share printed information with daughter and it would be the daughter's decision to admit herself into [facility]. Patient thanked CSW for resources and stated she had no more needs.   Rhea Pink, MSW,  [place]

## 2016-08-11 NOTE — H&P (Signed)
History and Physical  Patient Name: Kristen BloodgoodCarla B Hettich     ZOX:[id number]    DOB: Jun 02, 1971    DOA: 08/10/2016 PCP: No PCP Per Patient   Patient coming from: [facility]     Chief Complaint: Chest pain  HPI: Kristen Rowland is a 45 y.o. female with a past medical history significant for alcoholism and smoking who presents with chest pain.  The patient presents today with intermittent chest pain of days to weeks duration.  At its most severe, this has happened twice while going to sleep, with a severe sharp chest pain associated with shortness of breath and a feeling of panicking.  Tonight, however, she was drinking alcohol and cleaning the bathtub when she had onset of central chest pressure radiating to the right arm and neck associated with nausea and SOB and headache and the feeling that "someone was stepping on my chest".  She stopped what she was doing and called EMS.  Got 325 mg aspirin and nitro by EMS and her pain improved.    ED course: -Afebrile, heart rate 89, respirations and pulse ox normal, BP 117/76 -Initial ECG showed NSR and troponin was negative. -Na 147, K 3.5, Cr 0.76, WBC 6.6, Hgb 16 -CXR clear -TRH was asked to admit for observation, serial troponins and risk stratification.   The smokes and describes heart attacks in all paternal uncles, all four grandparents.  She has no history of MI or stroke or PVD.  No history of HTN, DM, or hyperlipidemia.    She drinks alcohol daily, has withdrawals and anxiety when she stops.  Desires to quit drinking.  Has no regular medical follow up.     Review of Systems:  ROS   Past Medical History:  Diagnosis Date  . Hypotension     Past Surgical History:  Procedure Laterality Date  . BREAST ENHANCEMENT SURGERY    . FOOT SURGERY    . NOSE SURGERY    . TUBAL LIGATION      Social History: Patient lives with her boyfriend and his roommate.  Patient walks unassisted.  She smokes.  She uses alcohol.    No Known  Allergies  Family history: family history includes Diabetes in her father and paternal uncle; Heart attack in her maternal grandfather, maternal grandmother, paternal grandfather, paternal grandmother, and paternal uncle.  Prior to Admission medications   Medication Sig Start Date End Date Taking? Authorizing Provider  None     Physical Exam: BP 117/76 (BP Location: Right Arm)   Pulse 89   Temp 97.5 F (36.4 C) (Oral)   Resp 13   Ht 5' 9.75" (1.772 m)   Wt 65.8 kg (145 lb)   SpO2 100%   BMI 20.95 kg/m  General appearance: Well-developed, adult female, alert and in no acute distress.   Eyes: Anicteric, conjunctiva pink, lids and lashes normal.     ENT: No nasal deformity, discharge, or epistaxis.  OP moist without lesions.   Skin: Warm and dry.   Cardiac: RRR, nl S1-S2, no murmurs appreciated.  Capillary refill is brisk.  JVP normal.  No LE edema.  Radial and DP pulses 2+ and symmetric.  No carotid bruits. Respiratory: Normal respiratory rate and rhythm.  CTAB without rales or wheezes. GI: Abdomen soft without rigidity.  No TTP. No ascites, distension.   MSK: No deformities or effusions.   Pain not reproduced with palpation of precordium.  No pain with arm movement. Neuro: Sensorium intact and responding to questions, attention normal.  Speech is fluent.  Moves all extremities equally and with normal coordination.    Psych: Behavior appropriate.  Affect flat.  No evidence of aural or visual hallucinations or delusions.       Labs on Admission:  The metabolic panel shows hypernatremia, normal renal function. The complete blood count shows no leukocytosis, thrombocytopenia. The initial troponin is negative.  Radiological Exams on Admission: Personally reviewed: Dg Chest 2 View  Result Date: 08/11/2016 CLINICAL DATA:  45 year old female with chest pain EXAM: CHEST  2 VIEW COMPARISON:  Chest radiograph dated 07/08/2014 FINDINGS: The heart size and mediastinal contours are  within normal limits. Both lungs are clear. The visualized skeletal structures are unremarkable. IMPRESSION: No acute cardiopulmonary process. Electronically Signed   By: Elgie CollardArash  Radparvar M.D.   On: 08/11/2016 00:56    EKG: Independently reviewed. Rate 93, QTc 464, normal sinus.    Assessment/Plan  1. Chest pain: This is new.  The patient has HEART score of 4. Her pain has typical and atypical features, but tonight was more suspicious.  Other potential causes of chest pain (PE, dissection, pancreatitis, pneumonia/effusion, pericarditis) are doubted.  We have been asked to admit the patient for observation and etiology consultation with Cardiology tomorrow.  -Serial troponins are ordered -Telemetry -Consult to cardiology, appreciate recommendations -Smoking cessation was recommended, specific modalities discussed, patient committed   2. Hypernatremia: In setting of alcohol use. -D5W at 100 cc/hr -Repeat BMP  3. Alcohol intoxication:  -CIWA protocol  -SW consult for outpatient treatment       DVT prophylaxis: Lovenox Diet: NPO after 4am for anticipated stress testing Code Status: FULL  Family Communication: None present  Disposition Plan: Anticipate overnight observation for arrhythmia on telemetry, serial troponins and subsequent risk stratification by Cardiology.  If testing negative, home after. Consults called: Cardology Admission status: Telemetry, OBS   Medical decision making: Patient seen at 4:40 AM on 08/11/2016.  The patient was discussed with Dr. Bebe ShaggyWickline. What exists of the patient's chart was reviewed in depth.  Clinical condition: stable.      Alberteen SamChristopher P Jasiri [place]alt [facility] Pager [phone]

## 2017-08-05 DIAGNOSIS — Z0000 Encounter for general adult medical examination without abnormal findings: Secondary | ICD-10-CM | POA: Insufficient documentation

## 2017-08-05 DIAGNOSIS — Z124 Encounter for screening for malignant neoplasm of cervix: Secondary | ICD-10-CM | POA: Insufficient documentation

## 2017-08-05 DIAGNOSIS — Z1239 Encounter for other screening for malignant neoplasm of breast: Secondary | ICD-10-CM | POA: Insufficient documentation

## 2017-08-06 DIAGNOSIS — F339 Major depressive disorder, recurrent, unspecified: Secondary | ICD-10-CM | POA: Insufficient documentation

## 2017-08-10 DIAGNOSIS — E782 Mixed hyperlipidemia: Secondary | ICD-10-CM | POA: Insufficient documentation

## 2018-03-15 ENCOUNTER — Encounter (HOSPITAL_COMMUNITY): Payer: Self-pay | Admitting: *Deleted

## 2018-03-15 ENCOUNTER — Emergency Department (HOSPITAL_COMMUNITY)
Admission: EM | Admit: 2018-03-15 | Discharge: 2018-03-16 | Disposition: A | Discharge status: Other Acute Inpatient Hospital | Payer: Medicaid Other | Attending: Emergency Medicine | Admitting: Emergency Medicine

## 2018-03-15 ENCOUNTER — Other Ambulatory Visit: Payer: Self-pay

## 2018-03-15 DIAGNOSIS — F322 Major depressive disorder, single episode, severe without psychotic features: Secondary | ICD-10-CM | POA: Insufficient documentation

## 2018-03-15 DIAGNOSIS — R45851 Suicidal ideations: Secondary | ICD-10-CM | POA: Insufficient documentation

## 2018-03-15 DIAGNOSIS — F332 Major depressive disorder, recurrent severe without psychotic features: Secondary | ICD-10-CM

## 2018-03-15 DIAGNOSIS — R030 Elevated blood-pressure reading, without diagnosis of hypertension: Secondary | ICD-10-CM | POA: Insufficient documentation

## 2018-03-15 DIAGNOSIS — F1010 Alcohol abuse, uncomplicated: Secondary | ICD-10-CM | POA: Insufficient documentation

## 2018-03-15 DIAGNOSIS — F17210 Nicotine dependence, cigarettes, uncomplicated: Secondary | ICD-10-CM | POA: Insufficient documentation

## 2018-03-15 DIAGNOSIS — F1020 Alcohol dependence, uncomplicated: Secondary | ICD-10-CM | POA: Diagnosis present

## 2018-03-15 LAB — CBC
HEMATOCRIT: 51.6 % — AB (ref 36.0–46.0)
HEMOGLOBIN: 18.2 g/dL — AB (ref 12.0–15.0)
MCH: 36.4 pg — ABNORMAL HIGH (ref 26.0–34.0)
MCHC: 35.3 g/dL (ref 30.0–36.0)
MCV: 103.2 fL — ABNORMAL HIGH (ref 78.0–100.0)
Platelets: 274 10*3/uL (ref 150–400)
RBC: 5 MIL/uL (ref 3.87–5.11)
RDW: 14.1 % (ref 11.5–15.5)
WBC: 9.3 10*3/uL (ref 4.0–10.5)

## 2018-03-15 LAB — RAPID URINE DRUG SCREEN, HOSP PERFORMED
Amphetamines: NOT DETECTED
BARBITURATES: NOT DETECTED
BENZODIAZEPINES: NOT DETECTED
COCAINE: NOT DETECTED
Opiates: NOT DETECTED
Tetrahydrocannabinol: NOT DETECTED

## 2018-03-15 LAB — COMPREHENSIVE METABOLIC PANEL
ALT: 32 U/L (ref 0–44)
ANION GAP: 15 (ref 5–15)
AST: 41 U/L (ref 15–41)
Albumin: 4.4 g/dL (ref 3.5–5.0)
Alkaline Phosphatase: 87 U/L (ref 38–126)
BILIRUBIN TOTAL: 0.4 mg/dL (ref 0.3–1.2)
BUN: 15 mg/dL (ref 6–20)
CO2: 22 mmol/L (ref 22–32)
Calcium: 9 mg/dL (ref 8.9–10.3)
Chloride: 106 mmol/L (ref 98–111)
Creatinine, Ser: 0.65 mg/dL (ref 0.44–1.00)
GFR calc Af Amer: >60 mL/min (ref >60)
GLUCOSE: 142 mg/dL — AB (ref 70–99)
POTASSIUM: 3.8 mmol/L (ref 3.5–5.1)
Sodium: 143 mmol/L (ref 135–145)
TOTAL PROTEIN: 8.1 g/dL (ref 6.5–8.1)

## 2018-03-15 LAB — SALICYLATE LEVEL: Salicylate Lvl: <7 mg/dL (ref 2.8–30.0)

## 2018-03-15 LAB — I-STAT BETA HCG BLOOD, ED (MC, WL, AP ONLY): I-stat hCG, quantitative: <5 m[IU]/mL (ref <5)

## 2018-03-15 LAB — ACETAMINOPHEN LEVEL

## 2018-03-15 LAB — ETHANOL: Alcohol, Ethyl (B): 312 mg/dL (ref <10)

## 2018-03-15 MED ORDER — VITAMIN B-1 100 MG PO TABS
100.0000 mg | ORAL_TABLET | Freq: Every day | ORAL | Status: DC
  Administered 2018-03-15: 100 mg via ORAL
  Filled 2018-03-15: qty 1

## 2018-03-15 MED ORDER — HYDROXYZINE HCL 25 MG PO TABS
25.0000 mg | ORAL_TABLET | Freq: Four times a day (QID) | ORAL | Status: DC | PRN
  Administered 2018-03-16: 25 mg via ORAL
  Filled 2018-03-15: qty 1

## 2018-03-15 MED ORDER — LORAZEPAM 2 MG/ML IJ SOLN: 0.0000 mg | Freq: Four times a day (QID) | INTRAMUSCULAR | Status: DC

## 2018-03-15 MED ORDER — THIAMINE HCL 100 MG/ML IJ SOLN
100.0000 mg | Freq: Every day | INTRAMUSCULAR | Status: DC
  Administered 2018-03-15: 100 mg via INTRAVENOUS
  Filled 2018-03-15: qty 2

## 2018-03-15 MED ORDER — LORAZEPAM 1 MG PO TABS: 0.0000 mg | ORAL_TABLET | Freq: Two times a day (BID) | ORAL | Status: DC

## 2018-03-15 MED ORDER — NICOTINE 21 MG/24HR TD PT24
21.0000 mg | MEDICATED_PATCH | Freq: Once | TRANSDERMAL | Status: DC
  Administered 2018-03-15: 21 mg via TRANSDERMAL
  Filled 2018-03-15: qty 1

## 2018-03-15 MED ORDER — LORAZEPAM 2 MG/ML IJ SOLN
0.0000 mg | Freq: Two times a day (BID) | INTRAMUSCULAR | Status: DC
Start: 2018-03-18 — End: 2018-03-16

## 2018-03-15 MED ORDER — LORAZEPAM 1 MG PO TABS
0.0000 mg | ORAL_TABLET | Freq: Four times a day (QID) | ORAL | Status: DC
  Administered 2018-03-15: 2 mg via ORAL
  Filled 2018-03-15: qty 2

## 2018-03-15 MED ORDER — SODIUM CHLORIDE 0.9 % IV BOLUS
1000.0000 mL | Freq: Once | INTRAVENOUS | Status: AC
  Administered 2018-03-15: 1000 mL via INTRAVENOUS

## 2018-03-15 MED ORDER — TRAZODONE HCL 50 MG PO TABS
50.0000 mg | ORAL_TABLET | Freq: Once | ORAL | Status: AC
  Administered 2018-03-15: 50 mg via ORAL
  Filled 2018-03-15: qty 1

## 2018-03-15 MED ORDER — ACETAMINOPHEN 325 MG PO TABS: 650.0000 mg | ORAL_TABLET | ORAL | Status: DC | PRN

## 2018-03-15 NOTE — ED Triage Notes (Signed)
Pt arrives voluntary with [employer], says she just wants to cut her wrist and die. "If I could find a way that was no so painful I would, I have been doing research on the best way to kill yourself, will the doctors help me die? I just want to die."
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
## 2018-03-15 NOTE — [facility] Counselor (Signed)
Disposition:   [facility], Nira ConnJason Berry, NP who recommends inpatient treatment.  LPC informed pt's nurse and ER provider of the recommended disposition.  TTS will look for placement.  Layne Dilauro L. Kariya Lavergne, MS, LPC, [facility] Therapeutic Triage Specialist  [phone]

## 2018-03-15 NOTE — ED Triage Notes (Signed)
Pt has had belongings removed, labs drawn, wanded by security. [facility]

## 2018-03-15 NOTE — ED Notes (Addendum)
Pt is alert x4 has been looking up ways to harm herself on the Internet. Pt states she wants to "leave now" and is pacing around the nurses station. Pt is now IVC at this time, she has had her pm meds and is cooperative at this time. I will continue to monitor.

## 2018-03-16 ENCOUNTER — Other Ambulatory Visit: Payer: Self-pay

## 2018-03-16 ENCOUNTER — Encounter (HOSPITAL_COMMUNITY): Payer: Self-pay

## 2018-03-16 ENCOUNTER — Inpatient Hospital Stay (HOSPITAL_COMMUNITY)
Admission: AD | Admit: 2018-03-16 | Discharge: 2018-03-18 | DRG: 885 | Disposition: A | Discharge status: Home / Self Care | Payer: Medicaid Other | Source: Intra-hospital | Attending: Psychiatry | Admitting: Psychiatry

## 2018-03-16 DIAGNOSIS — Z818 Family history of other mental and behavioral disorders: Secondary | ICD-10-CM | POA: Diagnosis not present

## 2018-03-16 DIAGNOSIS — G4700 Insomnia, unspecified: Secondary | ICD-10-CM | POA: Diagnosis present

## 2018-03-16 DIAGNOSIS — Y908 Blood alcohol level of 240 mg/100 ml or more: Secondary | ICD-10-CM | POA: Diagnosis present

## 2018-03-16 DIAGNOSIS — F17210 Nicotine dependence, cigarettes, uncomplicated: Secondary | ICD-10-CM | POA: Diagnosis present

## 2018-03-16 DIAGNOSIS — Z810 Family history of intellectual disabilities: Secondary | ICD-10-CM

## 2018-03-16 DIAGNOSIS — R45851 Suicidal ideations: Secondary | ICD-10-CM | POA: Diagnosis present

## 2018-03-16 DIAGNOSIS — Z9851 Tubal ligation status: Secondary | ICD-10-CM | POA: Diagnosis not present

## 2018-03-16 DIAGNOSIS — F1020 Alcohol dependence, uncomplicated: Secondary | ICD-10-CM

## 2018-03-16 DIAGNOSIS — F1010 Alcohol abuse, uncomplicated: Secondary | ICD-10-CM | POA: Diagnosis present

## 2018-03-16 DIAGNOSIS — F332 Major depressive disorder, recurrent severe without psychotic features: Secondary | ICD-10-CM | POA: Diagnosis present

## 2018-03-16 DIAGNOSIS — F10280 Alcohol dependence with alcohol-induced anxiety disorder: Secondary | ICD-10-CM | POA: Diagnosis not present

## 2018-03-16 DIAGNOSIS — F419 Anxiety disorder, unspecified: Secondary | ICD-10-CM | POA: Diagnosis not present

## 2018-03-16 DIAGNOSIS — F4310 Post-traumatic stress disorder, unspecified: Secondary | ICD-10-CM | POA: Diagnosis present

## 2018-03-16 DIAGNOSIS — Z630 Problems in relationship with spouse or partner: Secondary | ICD-10-CM | POA: Diagnosis not present

## 2018-03-16 DIAGNOSIS — Z8701 Personal history of pneumonia (recurrent): Secondary | ICD-10-CM

## 2018-03-16 DIAGNOSIS — F1099 Alcohol use, unspecified with unspecified alcohol-induced disorder: Secondary | ICD-10-CM | POA: Diagnosis not present

## 2018-03-16 MED ORDER — MAGNESIUM HYDROXIDE 400 MG/5ML PO SUSP: 30.0000 mL | Freq: Every day | ORAL | Status: DC | PRN

## 2018-03-16 MED ORDER — VITAMIN B-1 100 MG PO TABS
100.0000 mg | ORAL_TABLET | Freq: Every day | ORAL | Status: DC
  Filled 2018-03-16 (×2): qty 1

## 2018-03-16 MED ORDER — HYDROXYZINE HCL 25 MG PO TABS
25.0000 mg | ORAL_TABLET | Freq: Four times a day (QID) | ORAL | Status: DC | PRN
  Administered 2018-03-16: 25 mg via ORAL
  Filled 2018-03-16: qty 1

## 2018-03-16 MED ORDER — CHLORDIAZEPOXIDE HCL 25 MG PO CAPS: 25.0000 mg | ORAL_CAPSULE | Freq: Every day | ORAL | Status: DC

## 2018-03-16 MED ORDER — LOPERAMIDE HCL 2 MG PO CAPS: 2.0000 mg | ORAL_CAPSULE | ORAL | Status: DC | PRN

## 2018-03-16 MED ORDER — ADULT MULTIVITAMIN W/MINERALS CH
1.0000 | ORAL_TABLET | Freq: Every day | ORAL | Status: DC
  Administered 2018-03-17 – 2018-03-18 (×2): 1 via ORAL
  Filled 2018-03-16 (×4): qty 1

## 2018-03-16 MED ORDER — TRAZODONE HCL 50 MG PO TABS
50.0000 mg | ORAL_TABLET | Freq: Every evening | ORAL | Status: DC | PRN
  Administered 2018-03-16: 50 mg via ORAL
  Filled 2018-03-16: qty 1

## 2018-03-16 MED ORDER — ACETAMINOPHEN 325 MG PO TABS: 650.0000 mg | ORAL_TABLET | Freq: Four times a day (QID) | ORAL | Status: DC | PRN

## 2018-03-16 MED ORDER — THIAMINE HCL 100 MG/ML IJ SOLN: 100.0000 mg | Freq: Once | INTRAMUSCULAR | Status: DC

## 2018-03-16 MED ORDER — CHLORDIAZEPOXIDE HCL 25 MG PO CAPS
25.0000 mg | ORAL_CAPSULE | Freq: Four times a day (QID) | ORAL | Status: DC
  Administered 2018-03-16: 25 mg via ORAL
  Filled 2018-03-16 (×2): qty 1

## 2018-03-16 MED ORDER — ONDANSETRON 4 MG PO TBDP: 4.0000 mg | ORAL_TABLET | Freq: Four times a day (QID) | ORAL | Status: DC | PRN

## 2018-03-16 MED ORDER — BUSPIRONE HCL 10 MG PO TABS
10.0000 mg | ORAL_TABLET | Freq: Two times a day (BID) | ORAL | Status: DC
  Administered 2018-03-16 – 2018-03-17 (×2): 10 mg via ORAL
  Filled 2018-03-16 (×2): qty 1
  Filled 2018-03-16: qty 2
  Filled 2018-03-16: qty 1
  Filled 2018-03-16: qty 2
  Filled 2018-03-16: qty 1

## 2018-03-16 MED ORDER — HYDROXYZINE HCL 25 MG PO TABS
25.0000 mg | ORAL_TABLET | Freq: Four times a day (QID) | ORAL | Status: DC | PRN
  Administered 2018-03-17: 25 mg via ORAL
  Filled 2018-03-16: qty 1

## 2018-03-16 MED ORDER — NICOTINE 21 MG/24HR TD PT24
21.0000 mg | MEDICATED_PATCH | Freq: Every day | TRANSDERMAL | Status: DC
  Administered 2018-03-16 – 2018-03-18 (×3): 21 mg via TRANSDERMAL
  Filled 2018-03-16 (×6): qty 1

## 2018-03-16 MED ORDER — CHLORDIAZEPOXIDE HCL 25 MG PO CAPS: 25.0000 mg | ORAL_CAPSULE | ORAL | Status: DC

## 2018-03-16 MED ORDER — CHLORDIAZEPOXIDE HCL 25 MG PO CAPS: 25.0000 mg | ORAL_CAPSULE | Freq: Three times a day (TID) | ORAL | Status: DC

## 2018-03-16 MED ORDER — ALUM & MAG HYDROXIDE-SIMETH 200-200-20 MG/5ML PO SUSP: 30.0000 mL | ORAL | Status: DC | PRN

## 2018-03-16 MED ORDER — CHLORDIAZEPOXIDE HCL 25 MG PO CAPS: 25.0000 mg | ORAL_CAPSULE | Freq: Four times a day (QID) | ORAL | Status: DC | PRN

## 2018-03-16 MED ORDER — ADULT MULTIVITAMIN W/MINERALS CH
1.0000 | ORAL_TABLET | Freq: Every day | ORAL | Status: DC
  Administered 2018-03-16: 1 via ORAL
  Filled 2018-03-16 (×4): qty 1

## 2018-03-16 MED ORDER — VITAMIN B-1 100 MG PO TABS
100.0000 mg | ORAL_TABLET | Freq: Every day | ORAL | Status: DC
  Administered 2018-03-17 – 2018-03-18 (×2): 100 mg via ORAL
  Filled 2018-03-16 (×4): qty 1

## 2018-03-16 MED ORDER — ACAMPROSATE CALCIUM 333 MG PO TBEC
666.0000 mg | DELAYED_RELEASE_TABLET | Freq: Three times a day (TID) | ORAL | Status: DC
  Administered 2018-03-16 – 2018-03-17 (×2): 666 mg via ORAL
  Filled 2018-03-16 (×7): qty 2

## 2018-03-16 NOTE — BH Assessment (Signed)
  Disposition  Per AC, Clint Bolderori Beck, RN:   Nira ConnJason Berry, [facility] Bed 02.  Patient can arrive after 9:00am.  The attending provider will be Dr. Jama Flavorsobos.   Ramondo Dietze L. Rosbel Buckner, MS, LPC, [facility] Therapeutic Triage Specialist  [phone]

## 2018-03-16 NOTE — Plan of Care (Signed)
  Problem: Education: Goal: Knowledge of [place] [facility] Education information/materials will improve Outcome: Not Progressing   Problem: Education: Goal: Emotional status will improve Outcome: Not Progressing   Problem: Education: Goal: Mental status will improve Outcome: Not Progressing   Problem: Education: Goal: Verbalization of understanding the information provided will improve Outcome: Not Progressing   Problem: Activity: Goal: Interest or engagement in activities will improve Outcome: Not Progressing   
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
## 2018-03-16 NOTE — Progress Notes (Signed)
D: Patient denies SI, HI or AVH this evening. Patient presents as anxious and depressed but otherwise pleasant and cooperative.  Pt. Visited with her significant other, she attended evening group and was visualized in the dayroom interacting with others.  She denied any current withdrawal symptoms.  She requested medication for sleep and reported that her appetite is good.  A: Patient given emotional support from RN. Patient encouraged to come to staff with concerns and/or questions. Patient's medication routine continued. Patient's orders and plan of care reviewed.   R: Patient remains appropriate and cooperative. Will continue to monitor patient q15 minutes for safety.
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
## 2018-03-16 NOTE — BH Assessment (Signed)
[facility] Assessment Progress Note  Per EPIC record, [facility], by Nira ConnJason Berry, NP.  [facility] will be ready to receive pt after 09:00.  Pt is under IVC initiated by EDP Shaune Pollackameron Isaacs, MD, and [facility].  [facility] via Patent examinerlaw enforcement.  Please call report to [phone].  Pt's nurse has been notified.  Doylene Canninghomas Meiah Zamudio, [place] Behavioral Health Coordinator [phone]

## 2018-03-16 NOTE — ED Notes (Signed)
Pt belongings are in locker 27, black cell phone, flip flop, tank top, and colorful  pants noted.

## 2018-03-16 NOTE — [facility] Suicide Risk Assessment (Signed)
[facility] INPATIENT:  Family/Significant Other Suicide Prevention Education  Suicide Prevention Education:  Patient Refusal for Family/Significant Other Suicide Prevention Education: The patient Kristen Rowland has refused to provide written consent for family/significant other to be provided Family/Significant Other Suicide Prevention Education during admission and/or prior to discharge.  Physician notified.  SPE completed with pt, as pt refused to consent to family contact. SPI pamphlet provided to pt and pt was encouraged to share information with support network, ask questions, and talk about any concerns relating to SPE. Pt denies access to guns/firearms and verbalized understanding of information provided. Mobile Crisis information also provided to pt.   Rona RavensHeather S Deysha Cartier LCSW 03/16/2018, 2:14 PM
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
## 2018-03-16 NOTE — [facility] Group Notes (Signed)
LCSW Group Therapy Note   03/16/2018 1:15pm   Type of Therapy and Topic:  Group Therapy:  Overcoming Obstacles   Participation Level:  Did Not Attend--pt left room when group began. Chose to remain in bed. Pt remains isolative in room and is upset that she is not discharging.    Description of Group:    In this group patients will be encouraged to explore what they see as obstacles to their own wellness and recovery. They will be guided to discuss their thoughts, feelings, and behaviors related to these obstacles. The group will process together ways to cope with barriers, with attention given to specific choices patients can make. Each patient will be challenged to identify changes they are motivated to make in order to overcome their obstacles. This group will be process-oriented, with patients participating in exploration of their own experiences as well as giving and receiving support and challenge from other group members.   Therapeutic Goals: 1. Patient will identify personal and current obstacles as they relate to admission. 2. Patient will identify barriers that currently interfere with their wellness or overcoming obstacles.  3. Patient will identify feelings, thought process and behaviors related to these barriers. 4. Patient will identify two changes they are willing to make to overcome these obstacles:      Summary of Patient Progress   x   Therapeutic Modalities:   Cognitive Behavioral Therapy Solution Focused Therapy Motivational Interviewing Relapse Prevention Therapy  Rona RavensHeather S Reiley Bertagnolli, LCSW 03/16/2018 2:46 PM
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
## 2018-03-17 DIAGNOSIS — F17210 Nicotine dependence, cigarettes, uncomplicated: Secondary | ICD-10-CM

## 2018-03-17 DIAGNOSIS — F1099 Alcohol use, unspecified with unspecified alcohol-induced disorder: Secondary | ICD-10-CM

## 2018-03-17 DIAGNOSIS — G4700 Insomnia, unspecified: Secondary | ICD-10-CM

## 2018-03-17 DIAGNOSIS — F332 Major depressive disorder, recurrent severe without psychotic features: Principal | ICD-10-CM

## 2018-03-17 LAB — TSH: TSH: 4.852 u[IU]/mL — AB (ref 0.350–4.500)

## 2018-03-17 MED ORDER — TRAZODONE HCL 50 MG PO TABS
50.0000 mg | ORAL_TABLET | Freq: Every evening | ORAL | Status: DC | PRN
  Administered 2018-03-17 (×2): 50 mg via ORAL
  Filled 2018-03-17 (×8): qty 1

## 2018-03-17 MED ORDER — BUSPIRONE HCL 5 MG PO TABS
10.0000 mg | ORAL_TABLET | Freq: Two times a day (BID) | ORAL | Status: DC | PRN
  Administered 2018-03-17 – 2018-03-18 (×2): 10 mg via ORAL
  Filled 2018-03-17 (×2): qty 2

## 2018-03-17 NOTE — Progress Notes (Signed)
[facility] Group Notes:  (Nursing/MHT/Case Management/Adjunct)  Date:  03/17/2018  Time: 2045  Type of Therapy:  wrap up group  Participation Level:  Active  Participation Quality:  Appropriate, Attentive, Sharing and Supportive  Affect:  Appropriate  Cognitive:  Appropriate  Insight:  Improving  Engagement in Group:  Engaged  Modes of Intervention:  Clarification, Education and Support  Summary of Progress/Problems: If patient could change any one thing about her life it would be her early marriage at 47 years of age that she chose over going to college. Pt is now working on her Masters degree in Psychology.   Marcille BuffyMcNeil, Tahlia Deamer S 03/17/2018, 9:44 PM
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
## 2018-03-17 NOTE — [facility] Group Notes (Signed)
LCSW Group Therapy Note  03/17/2018 1:15pm  Type of Therapy/Topic:  Group Therapy:  Feelings about Diagnosis  Participation Level:  Active   Description of Group:   This group will allow patients to explore their thoughts and feelings about diagnoses they have received. Patients will be guided to explore their level of understanding and acceptance of these diagnoses. Facilitator will encourage patients to process their thoughts and feelings about the reactions of others to their diagnosis and will guide patients in identifying ways to discuss their diagnosis with significant others in their lives. This group will be process-oriented, with patients participating in exploration of their own experiences, giving and receiving support, and processing challenge from other group members.   Therapeutic Goals: 1. Patient will demonstrate understanding of diagnosis as evidenced by identifying two or more symptoms of the disorder 2. Patient will be able to express two feelings regarding the diagnosis 3. Patient will demonstrate their ability to communicate their needs through discussion and/or role play  Summary of Patient Progress:  Kristen Rowland was attentive and engaged during today's processing group. She shared that she is starting to view herself as an alcoholic and is thinking more about attending AA and getting help to deal with this addiction. "My boyfriend drinks and I have to learn to not drink even if he is." Kristen Rowland continues to show progress in the group setting. She is highly focused on discharge and wants to discharge on Friday. "I"m concerned about my job."   Therapeutic Modalities:   Cognitive Behavioral Therapy Brief Therapy Feelings Identification    Rona RavensHeather S Kalasia Crafton, LCSW 03/17/2018 2:31 PM

## 2018-03-17 NOTE — Plan of Care (Signed)
  Problem: Education: Goal: Knowledge of [facility] Education information/materials will improve Outcome: Progressing   Problem: Education: Goal: Mental status will improve Outcome: Progressing   Problem: Education: Goal: Verbalization of understanding the information provided will improve Outcome: Progressing

## 2018-03-17 NOTE — Progress Notes (Signed)
Patient was appropriate and calm upon approach. Patient voiced no concerns other than not sleeping well last night. Encouraged to talk to the provider. Denies SI HI AVH. Voices she wishes to go home. Support and encouragement provided. Patient compliant with medications ordered per provider. Safety maintained with 15 minute checks. Will continue to monitor.

## 2018-03-18 MED ORDER — BUSPIRONE HCL 10 MG PO TABS
10.0000 mg | ORAL_TABLET | Freq: Two times a day (BID) | ORAL | Status: DC
  Filled 2018-03-18 (×4): qty 1

## 2018-03-18 MED ORDER — TRAZODONE HCL 50 MG PO TABS: 50.0000 mg | ORAL_TABLET | Freq: Every evening | ORAL | 0 refills | Status: DC | PRN

## 2018-03-18 MED ORDER — BUSPIRONE HCL 10 MG PO TABS: 10.0000 mg | ORAL_TABLET | Freq: Two times a day (BID) | ORAL | 0 refills | Status: DC | PRN

## 2018-03-18 MED ORDER — BUSPIRONE HCL 10 MG PO TABS: 10.0000 mg | ORAL_TABLET | Freq: Two times a day (BID) | ORAL | 0 refills | Status: DC

## 2018-03-18 MED ORDER — HYDROXYZINE HCL 25 MG PO TABS: 25.0000 mg | ORAL_TABLET | Freq: Four times a day (QID) | ORAL | 0 refills | Status: DC | PRN

## 2018-03-18 NOTE — Progress Notes (Signed)
Recreation Therapy Notes  Date: 7.26.19 Time: 0930 Location: [address] Dayroom  Group Topic: Stress Management  Goal Area(s) Addresses:  Patient will verbalize importance of using healthy stress management.  Patient will identify positive emotions associated with healthy stress management.   Behavioral Response: Engaged  Intervention: Stress Management  Activity : Progressive Muscle Relaxation.  LRT introduced the stress management technique of progressive muscle relaxation.  LRT read Rowland script that guided patients through Rowland series of tensing muscles and then relaxing.  Patients were to follow along as LRT read script to engage in the activity.  Education:  Stress Management, Discharge Planning.   Education Outcome: Acknowledges edcuation/In group clarification offered/Needs additional education  Clinical Observations/Feedback: Pt attended and participated in group.    Kristen Rowland, LRT/CTRS         Kristen Rowland 03/18/2018 10:56 AM 

## 2018-03-18 NOTE — Tx Team (Signed)
Interdisciplinary Treatment and Diagnostic Plan Update  03/18/2018 Time of Session: 0830AM Kristen Rowland MRN: [id number]  Principal Diagnosis: Severe recurrent major depression without psychotic features [facility])  Secondary Diagnoses: Principal Problem:   Severe recurrent major depression without psychotic features [facility]) Active Problems:   Moderate alcohol use disorder (HCC)   Major depressive disorder, recurrent severe without psychotic features (HCC)   Current Medications:  Current Facility-Administered Medications  Medication Dose Route Frequency Provider Last Rate Last Dose  . acetaminophen (TYLENOL) tablet 650 mg  650 mg Oral Q6H PRN Nira Conn A, NP      . alum & mag hydroxide-simeth (MAALOX/MYLANTA) 200-200-20 MG/5ML suspension 30 mL  30 mL Oral Q4H PRN Nira Conn A, NP      . busPIRone (BUSPAR) tablet 10 mg  10 mg Oral BID PRN Mariel Craft, MD   10 mg at 03/18/18 0735  . chlordiazePOXIDE (LIBRIUM) capsule 25 mg  25 mg Oral Q6H PRN Cobos, Fernando A, MD      . hydrOXYzine (ATARAX/VISTARIL) tablet 25 mg  25 mg Oral Q6H PRN Cobos, Rockey Situ, MD   25 mg at 03/17/18 2125  . loperamide (IMODIUM) capsule 2-4 mg  2-4 mg Oral PRN Cobos, Rockey Situ, MD      . magnesium hydroxide (MILK OF MAGNESIA) suspension 30 mL  30 mL Oral Daily PRN Nira Conn A, NP      . multivitamin with minerals tablet 1 tablet  1 tablet Oral Daily Cobos, Rockey Situ, MD   1 tablet at 03/18/18 0733  . nicotine (NICODERM CQ - dosed in mg/24 hours) patch 21 mg  21 mg Transdermal Daily Cobos, Rockey Situ, MD   21 mg at 03/18/18 0734  . ondansetron (ZOFRAN-ODT) disintegrating tablet 4 mg  4 mg Oral Q6H PRN Cobos, Fernando A, MD      . thiamine (VITAMIN B-1) tablet 100 mg  100 mg Oral Daily Cobos, Rockey Situ, MD   100 mg at 03/18/18 0733  . traZODone (DESYREL) tablet 50 mg  50 mg Oral QHS,MR X 1 Mariel Craft, MD   50 mg at 03/17/18 2258   PTA Medications: Medications Prior to Admission  Medication Sig  Dispense Refill Last Dose  . chlordiazePOXIDE (LIBRIUM) 10 MG capsule Take 1 capsule (10 mg total) by mouth 3 (three) times daily as needed for anxiety. (Patient not taking: Reported on 03/15/2018) 3 capsule 0 Not Taking at Unknown time    Patient Stressors: Marital or family conflict Substance abuse Traumatic event  Patient Strengths: Ability for insight Average or above average intelligence Communication skills General fund of knowledge Motivation for treatment/growth  Treatment Modalities: Medication Management, Group therapy, Case management,  1 to 1 session with clinician, Psychoeducation, Recreational therapy.   Physician Treatment Plan for Primary Diagnosis: Severe recurrent major depression without psychotic features (HCC) Long Term Goal(s): Improvement in symptoms so as ready for discharge Improvement in symptoms so as ready for discharge   Short Term Goals: Ability to identify changes in lifestyle to reduce recurrence of condition will improve Ability to maintain clinical measurements within normal limits will improve Ability to identify triggers associated with substance abuse/mental health issues will improve  Medication Management: Evaluate patient's response, side effects, and tolerance of medication regimen.  Therapeutic Interventions: 1 to 1 sessions, Unit Group sessions and Medication administration.  Evaluation of Outcomes: Adequate for Discharge  Physician Treatment Plan for Secondary Diagnosis: Principal Problem:   Severe recurrent major depression without psychotic features (HCC) Active Problems:  Moderate alcohol use disorder (HCC)   Major depressive disorder, recurrent severe without psychotic features (HCC)  Long Term Goal(s): Improvement in symptoms so as ready for discharge Improvement in symptoms so as ready for discharge   Short Term Goals: Ability to identify changes in lifestyle to reduce recurrence of condition will improve Ability to maintain  clinical measurements within normal limits will improve Ability to identify triggers associated with substance abuse/mental health issues will improve     Medication Management: Evaluate patient's response, side effects, and tolerance of medication regimen.  Therapeutic Interventions: 1 to 1 sessions, Unit Group sessions and Medication administration.  Evaluation of Outcomes: Adequate for Discharge   RN Treatment Plan for Primary Diagnosis: Severe recurrent major depression without psychotic features (HCC) Long Term Goal(s): Knowledge of disease and therapeutic regimen to maintain health will improve  Short Term Goals: Ability to remain free from injury will improve, Ability to verbalize frustration and anger appropriately will improve, Ability to verbalize feelings will improve and Ability to disclose and discuss suicidal ideas  Medication Management: RN will administer medications as ordered by provider, will assess and evaluate patient's response and provide education to patient for prescribed medication. RN will report any adverse and/or side effects to prescribing provider.  Therapeutic Interventions: 1 on 1 counseling sessions, Psychoeducation, Medication administration, Evaluate responses to treatment, Monitor vital signs and CBGs as ordered, Perform/monitor CIWA, COWS, AIMS and Fall Risk screenings as ordered, Perform wound care treatments as ordered.  Evaluation of Outcomes: Adequate for Discharge   LCSW Treatment Plan for Primary Diagnosis: Severe recurrent major depression without psychotic features (HCC) Long Term Goal(s): Safe transition to appropriate next level of care at discharge, Engage patient in therapeutic group addressing interpersonal concerns.  Short Term Goals: Engage patient in aftercare planning with referrals and resources, Facilitate patient progression through stages of change regarding substance use diagnoses and concerns and Identify triggers associated with  mental health/substance abuse issues  Therapeutic Interventions: Assess for all discharge needs, 1 to 1 time with Social worker, Explore available resources and support systems, Assess for adequacy in community support network, Educate family and significant other(s) on suicide prevention, Complete Psychosocial Assessment, Interpersonal group therapy.  Evaluation of Outcomes: Adequate for Discharge   Progress in Treatment: Attending groups: Yes. Participating in groups: Yes. Taking medication as prescribed: Yes. Toleration medication: Yes. Family/Significant other contact made: SPE completed with pt; pt declined to consent to collateral contact.  Patient understands diagnosis: Yes. Discussing patient identified problems/goals with staff: Yes. Medical problems stabilized or resolved: Yes. Denies suicidal/homicidal ideation: Yes. Issues/concerns per patient self-inventory: Yes. Other: n/a   New problem(s) identified: No, Describe:  n/a  New Short Term/Long Term Goal(s): detox, medication management for mood stabilization; elimination of SI thoughts; development of comprehensive mental wellness/sobriety plan.   Patient Goals:  "To get some help with my anxiety and go home as soon as possible."   Discharge Plan or Barriers: Pt plans to return home; Follow-up appt made at [facility]. Pt given AA list for [facility] as well. [facility] pamphlet, Mobile Crisis information, and AA/NA information provided to patient for additional community support and resources.   Reason for Continuation of Hospitalization: none  Estimated Length of Stay: Friday, 03/18/18  Attendees: Patient: 03/18/2018 8:57 AM  Physician: Dr. Viviano Simas MD; Dr. Jama Flavors MD 03/18/2018 8:57 AM  Nursing: Arlyss Repress RN; Smiley RN 03/18/2018 8:57 AM  RN Care Manager:x 03/18/2018 8:57 AM  Social Worker: Corrie Mckusick LCSW 03/18/2018 8:57 AM  Recreational Therapist: x 03/18/2018 8:57  AM  Other: Armandina StammerAgnes Nwoko NP; Gilda Creaseakia Starke NP 03/18/2018 8:57 AM   Other:  03/18/2018 8:57 AM  Other: 03/18/2018 8:57 AM    Scribe for Treatment Team: Rona RavensHeather S Tiamarie Furnari, LCSW 03/18/2018 8:57 AM
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
## 2018-03-18 NOTE — Progress Notes (Signed)
Discharge note: Patient reviewed discharge paperwork with RN including prescriptions, follow up appointments, and lab work. Patient given the opportunity to ask questions. All concerns were addressed. All belongings were returned to patient. Denied SI/HI/AVH. Patient thanked staff for their care while at the hospital.  Patient was discharged to lobby where [facility].

## 2018-03-18 NOTE — Progress Notes (Signed)
Patient self inventory- Patient slept well last night. Sleep medication was requested and it was helpful. Appetite has been good, denies SI HI AVH, concentration good. Depression, hopelessness, and anxiety all rated 0 out of 10. Denies physical problems. Patient's goal is "following through with my plan to go to AA and [facility]."  Safety maintained with 15 minute checks. Will continue to monitor.

## 2018-03-18 NOTE — Plan of Care (Signed)
  Problem: Education: Goal: Knowledge of [place] [facility] Education information/materials will improve Outcome: Adequate for Discharge   Problem: Education: Goal: Emotional status will improve Outcome: Adequate for Discharge   Problem: Education: Goal: Mental status will improve Outcome: Adequate for Discharge   Problem: Education: Goal: Verbalization of understanding the information provided will improve Outcome: Adequate for Discharge   Problem: Activity: Goal: Interest or engagement in activities will improve Outcome: Adequate for Discharge   
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
## 2018-03-18 NOTE — Progress Notes (Signed)
  [facility] Discharge Plan :  Will you be returning to the same living situation after discharge:  Yes,  home At discharge, do you have transportation home?: Yes,  boyfriend Do you have the ability to pay for your medications: Yes,  [facility] Medicaid  Release of information consent forms completed and submitted to medical records by CSW.  Patient to Follow up at: Follow-up Information    [facility] Follow up on 03/24/2018.   Specialty:  Behavioral Health Why:  Hospital follow-up on Thursday, 8/1 at 8:30AM. Please bring: picture ID, medicaid card, and hospital discharge paperwork to this appt. Thank you.  Contact information: [address] [place] [address] [phone]           Next level of care provider has access to [facility] Link:no  Safety Planning and Suicide Prevention discussed: Yes,  SPE completed with pt; pt declined to consent to collateral contact. SPI pamphlet and Mobile Crisis information also provided to pt.   Have you used any form of tobacco in the last 30 days? (Cigarettes, Smokeless Tobacco, Cigars, and/or Pipes): Yes  Has patient been referred to the Quitline?: Patient refused referral  Patient has been referred for addiction treatment: Yes  [name], LCSW 03/18/2018, 8:56 AM

## 2018-08-15 ENCOUNTER — Emergency Department (HOSPITAL_COMMUNITY)
Admission: EM | Admit: 2018-08-15 | Discharge: 2018-08-16 | Discharge status: Against Medical Advice | Payer: Medicaid Other | Attending: Emergency Medicine | Admitting: Emergency Medicine

## 2018-08-15 ENCOUNTER — Encounter (HOSPITAL_COMMUNITY): Payer: Self-pay

## 2018-08-15 ENCOUNTER — Other Ambulatory Visit: Payer: Self-pay

## 2018-08-15 DIAGNOSIS — X58XXXA Exposure to other specified factors, initial encounter: Secondary | ICD-10-CM | POA: Diagnosis not present

## 2018-08-15 DIAGNOSIS — Y999 Unspecified external cause status: Secondary | ICD-10-CM | POA: Diagnosis not present

## 2018-08-15 DIAGNOSIS — Y939 Activity, unspecified: Secondary | ICD-10-CM | POA: Diagnosis not present

## 2018-08-15 DIAGNOSIS — F17210 Nicotine dependence, cigarettes, uncomplicated: Secondary | ICD-10-CM | POA: Insufficient documentation

## 2018-08-15 DIAGNOSIS — R45851 Suicidal ideations: Secondary | ICD-10-CM | POA: Insufficient documentation

## 2018-08-15 DIAGNOSIS — Z79899 Other long term (current) drug therapy: Secondary | ICD-10-CM | POA: Diagnosis not present

## 2018-08-15 DIAGNOSIS — Y929 Unspecified place or not applicable: Secondary | ICD-10-CM | POA: Insufficient documentation

## 2018-08-15 DIAGNOSIS — F329 Major depressive disorder, single episode, unspecified: Secondary | ICD-10-CM | POA: Diagnosis present

## 2018-08-15 DIAGNOSIS — S61512A Laceration without foreign body of left wrist, initial encounter: Secondary | ICD-10-CM | POA: Diagnosis not present

## 2018-08-15 DIAGNOSIS — S61412A Laceration without foreign body of left hand, initial encounter: Secondary | ICD-10-CM

## 2018-08-15 DIAGNOSIS — F29 Unspecified psychosis not due to a substance or known physiological condition: Secondary | ICD-10-CM | POA: Insufficient documentation

## 2018-08-15 DIAGNOSIS — F22 Delusional disorders: Secondary | ICD-10-CM | POA: Insufficient documentation

## 2018-08-15 MED ORDER — HYDROXYZINE HCL 25 MG PO TABS: 25.0000 mg | ORAL_TABLET | Freq: Four times a day (QID) | ORAL | Status: DC | PRN

## 2018-08-15 MED ORDER — BUSPIRONE HCL 10 MG PO TABS: 10.0000 mg | ORAL_TABLET | Freq: Two times a day (BID) | ORAL | Status: DC

## 2018-08-15 MED ORDER — TRAZODONE HCL 50 MG PO TABS: 50.0000 mg | ORAL_TABLET | Freq: Every evening | ORAL | Status: DC | PRN
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
## 2018-08-15 NOTE — ED Notes (Signed)
Bed: [facility] Expected date:  Expected time:  Means of arrival:  Comments: 
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
## 2018-08-16 NOTE — ED Notes (Signed)
Pt left without being seen. Provider aware of situation.

## 2018-09-29 ENCOUNTER — Other Ambulatory Visit: Payer: Self-pay | Admitting: Family

## 2018-09-29 DIAGNOSIS — Z1231 Encounter for screening mammogram for malignant neoplasm of breast: Secondary | ICD-10-CM

## 2019-05-16 ENCOUNTER — Emergency Department (HOSPITAL_COMMUNITY)
Admission: EM | Admit: 2019-05-16 | Discharge: 2019-05-16 | Discharge status: Against Medical Advice | Payer: Medicaid Other
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
## 2019-05-16 NOTE — ED Notes (Signed)
Pt seen walking out to lobby. This RN asked if she was leaving and pt walked out the door. PA witnessed as well.

## 2021-07-09 ENCOUNTER — Other Ambulatory Visit: Payer: Self-pay | Admitting: Nurse Practitioner

## 2021-07-09 DIAGNOSIS — Z1231 Encounter for screening mammogram for malignant neoplasm of breast: Secondary | ICD-10-CM

## 2023-05-08 ENCOUNTER — Emergency Department (HOSPITAL_BASED_OUTPATIENT_CLINIC_OR_DEPARTMENT_OTHER): Payer: Medicaid Other

## 2023-05-08 ENCOUNTER — Encounter (HOSPITAL_BASED_OUTPATIENT_CLINIC_OR_DEPARTMENT_OTHER): Payer: Self-pay

## 2023-05-08 ENCOUNTER — Emergency Department (HOSPITAL_BASED_OUTPATIENT_CLINIC_OR_DEPARTMENT_OTHER)
Admission: EM | Admit: 2023-05-08 | Discharge: 2023-05-08 | Disposition: A | Discharge status: Home / Self Care | Payer: Medicaid Other | Attending: Emergency Medicine | Admitting: Emergency Medicine

## 2023-05-08 ENCOUNTER — Other Ambulatory Visit: Payer: Self-pay

## 2023-05-08 DIAGNOSIS — R42 Dizziness and giddiness: Secondary | ICD-10-CM | POA: Insufficient documentation

## 2023-05-08 DIAGNOSIS — M79602 Pain in left arm: Secondary | ICD-10-CM | POA: Diagnosis present

## 2023-05-08 LAB — BASIC METABOLIC PANEL
Anion gap: 12 (ref 5–15)
BUN: 16 mg/dL (ref 6–20)
CO2: 26 mmol/L (ref 22–32)
Calcium: 8.9 mg/dL (ref 8.9–10.3)
Chloride: 101 mmol/L (ref 98–111)
Creatinine, Ser: 0.84 mg/dL (ref 0.44–1.00)
GFR, Estimated: >60 mL/min (ref >60)
Glucose, Bld: 124 mg/dL — ABNORMAL HIGH (ref 70–99)
Potassium: 3.7 mmol/L (ref 3.5–5.1)
Sodium: 139 mmol/L (ref 135–145)

## 2023-05-08 LAB — CBG MONITORING, ED: Glucose-Capillary: 130 mg/dL — ABNORMAL HIGH (ref 70–99)

## 2023-05-08 LAB — CBC
HCT: 46.2 % — ABNORMAL HIGH (ref 36.0–46.0)
Hemoglobin: 15.8 g/dL — ABNORMAL HIGH (ref 12.0–15.0)
MCH: 32.4 pg (ref 26.0–34.0)
MCHC: 34.2 g/dL (ref 30.0–36.0)
MCV: 94.9 fL (ref 80.0–100.0)
Platelets: 359 10*3/uL (ref 150–400)
RBC: 4.87 MIL/uL (ref 3.87–5.11)
RDW: 13.8 % (ref 11.5–15.5)
WBC: 11.4 10*3/uL — ABNORMAL HIGH (ref 4.0–10.5)
nRBC: 0 % (ref 0.0–0.2)

## 2023-05-08 LAB — TROPONIN I (HIGH SENSITIVITY): Troponin I (High Sensitivity): 2 ng/L (ref <18)

## 2023-05-08 MED ORDER — SODIUM CHLORIDE 0.9 % IV BOLUS
1000.0000 mL | Freq: Once | INTRAVENOUS | Status: AC
  Administered 2023-05-08: 1000 mL via INTRAVENOUS

## 2023-05-08 MED ORDER — ONDANSETRON HCL 4 MG/2ML IJ SOLN
4.0000 mg | Freq: Once | INTRAMUSCULAR | Status: AC
  Administered 2023-05-08: 4 mg via INTRAVENOUS
  Filled 2023-05-08: qty 2

## 2023-05-08 NOTE — ED Provider Notes (Signed)
[place] EMERGENCY DEPARTMENT AT [facility] Provider Note   CSN: [id number] Arrival date & time: 05/08/23  1929     History  Chief Complaint  Patient presents with   Arm Pain    L   Nausea    NEVENA ALESSANDRO is a 52 y.o. female here presenting with left arm pain and nausea and dizziness and forgetfulness.  Patient has been having left arm pain for the last 3 days.  Patient states that her arm just felt sore.  Denies any chest pain.  Patient also feels lightheaded and dizzy.  Patient states that she feels more forgetful.  She states that she has not seen her doctor for about 3 years now.  She was told that she has borderline hypertension and borderline diabetes at that time.  The history is provided by the patient.       Home Medications Prior to Admission medications   Medication Sig Start Date End Date Taking? Authorizing Provider  busPIRone (BUSPAR) 10 MG tablet Take 1 tablet (10 mg total) by mouth 2 (two) times daily. For anxiety 03/18/18   Armandina Stammer I, NP  hydrOXYzine (ATARAX/VISTARIL) 25 MG tablet Take 1 tablet (25 mg total) by mouth every 6 (six) hours as needed for anxiety (or CIWA score </= 10). 03/18/18   Starkes-Perry, Juel Burrow, FNP  traZODone (DESYREL) 50 MG tablet Take 1 tablet (50 mg total) by mouth at bedtime and may repeat dose one time if needed. 03/18/18   Maryagnes Amos, FNP      Allergies    Patient has no known allergies.    Review of Systems   Review of Systems  Musculoskeletal:        Left arm pain  Neurological:  Positive for dizziness.  All other systems reviewed and are negative.   Physical Exam Updated Vital Signs BP (!) 111/95 (BP Location: Right Arm)   Pulse 92   Temp 98.2 F (36.8 C)   Resp 15   SpO2 96%  Physical Exam Vitals and nursing note reviewed.  Constitutional:      Appearance: Normal appearance.  HENT:     Head: Normocephalic.     Nose: Nose normal.     Mouth/Throat:     Mouth: Mucous membranes are  moist.  Eyes:     Extraocular Movements: Extraocular movements intact.     Pupils: Pupils are equal, round, and reactive to light.  Cardiovascular:     Rate and Rhythm: Normal rate and regular rhythm.     Pulses: Normal pulses.     Heart sounds: Normal heart sounds.  Pulmonary:     Effort: Pulmonary effort is normal.     Breath sounds: Normal breath sounds.  Abdominal:     General: Abdomen is flat.     Palpations: Abdomen is soft.  Musculoskeletal:        General: Normal range of motion.     Cervical back: Normal range of motion and neck supple.     Comments: Normal range of motion of the left shoulder.  No obvious arm tenderness.  Skin:    General: Skin is warm.  Neurological:     General: No focal deficit present.     Mental Status: She is alert and oriented to person, place, and time.  Psychiatric:        Mood and Affect: Mood normal.        Behavior: Behavior normal.     ED Results / Procedures / Treatments  Labs (all labs ordered are listed, but only abnormal results are displayed) Labs Reviewed  BASIC METABOLIC PANEL - Abnormal; Notable for the following components:      Result Value   Glucose, Bld 124 (*)    All other components within normal limits  CBC - Abnormal; Notable for the following components:   WBC 11.4 (*)    Hemoglobin 15.8 (*)    HCT 46.2 (*)    All other components within normal limits  CBG MONITORING, ED - Abnormal; Notable for the following components:   Glucose-Capillary 130 (*)    All other components within normal limits  TROPONIN I (HIGH SENSITIVITY)    EKG EKG Interpretation Date/Time:  Saturday May 08 2023 19:42:06 EDT Ventricular Rate:  83 PR Interval:  138 QRS Duration:  74 QT Interval:  368 QTC Calculation: 432 R Axis:   44  Text Interpretation: Normal sinus rhythm Cannot rule out Anterior infarct , age undetermined Abnormal ECG When compared with ECG of 15-Mar-2018 23:00, No significant change was found Confirmed by  Richardean Canal [phone]) on 05/08/2023 7:46:20 PM  Radiology DG Chest Port 1 View  Result Date: 05/08/2023 CLINICAL DATA:  Nausea for several days EXAM: PORTABLE CHEST 1 VIEW COMPARISON:  08/11/2016 FINDINGS: The heart size and mediastinal contours are within normal limits. Both lungs are clear. The visualized skeletal structures are unremarkable. IMPRESSION: No active disease. Electronically Signed   By: Alcide Clever M.D.   On: 05/08/2023 20:29   CT Head Wo Contrast  Result Date: 05/08/2023 CLINICAL DATA:  New onset headache EXAM: CT HEAD WITHOUT CONTRAST TECHNIQUE: Contiguous axial images were obtained from the base of the skull through the vertex without intravenous contrast. RADIATION DOSE REDUCTION: This exam was performed according to the departmental dose-optimization program which includes automated exposure control, adjustment of the mA and/or kV according to patient size and/or use of iterative reconstruction technique. COMPARISON:  07/09/2014 FINDINGS: Brain: There is no mass, hemorrhage or extra-axial collection. The size and configuration of the ventricles and extra-axial CSF spaces are normal. The brain parenchyma is normal, without acute or chronic infarction. Vascular: No abnormal hyperdensity of the major intracranial arteries or dural venous sinuses. No intracranial atherosclerosis. Skull: The visualized skull base, calvarium and extracranial soft tissues are normal. Sinuses/Orbits: No fluid levels or advanced mucosal thickening of the visualized paranasal sinuses. No mastoid or middle ear effusion. The orbits are normal. IMPRESSION: Normal head CT. Electronically Signed   By: Deatra Robinson M.D.   On: 05/08/2023 20:24    Procedures Procedures    Medications Ordered in ED Medications  sodium chloride 0.9 % bolus 1,000 mL (1,000 mLs Intravenous New Bag/Given 05/08/23 2025)  ondansetron (ZOFRAN) injection 4 mg (4 mg Intravenous Given 05/08/23 2026)    ED Course/ Medical Decision Making/  A&P                                 Medical Decision Making KRISTYANN BRAUM is a 52 y.o. female who presented with left arm pain and dizziness.  Consider musculoskeletal versus ACS versus brain mass.  Plan to get CBC and CMP and troponin and chest x-ray and CT head.  Symptoms for several days have low risk for ACS so 1 set of troponin is sufficient.  I have low suspicion for PE or dissection.  8:46 PM\ Reviewed patient's labs and independently interpreted CT scan and chest x-ray.  Labs unremarkable.  Troponin is negative x 1.  CT head is negative and chest x-ray clear.  I think patient likely has musculoskeletal pain.  At this point patient is stable for discharge.  Problems Addressed: Dizziness: acute illness or injury Pain of left upper extremity: acute illness or injury  Amount and/or Complexity of Data Reviewed Labs: ordered. Decision-making details documented in ED Course. Radiology: ordered and independent interpretation performed. Decision-making details documented in ED Course.  Risk Prescription drug management.    Final Clinical Impression(s) / ED Diagnoses Final diagnoses:  None    Rx / DC Orders ED Discharge Orders     None         Charlynne Pander, MD 05/08/23 2047

## 2023-05-08 NOTE — ED Triage Notes (Signed)
Pt c/o L arm pain & nausea, states that it started intermittently approx 3 days ago, "shrugging it off like whatever but today its constant/ persistent, just getting worse." Associated numbness to fingers "when it shoots down real bad."

## 2023-05-08 NOTE — Discharge Instructions (Signed)
As we discussed, your lab work including your kidney function and heart function is normal in the ER.  I have left a message with the social worker to contact you on Monday regarding insurance and follow-up.  I have also referred you to our wellness center for follow-up to get further testing.  Return to ER if you have worse dizziness or arm pain or chest pain

## 2023-05-24 ENCOUNTER — Ambulatory Visit: Payer: Self-pay | Admitting: *Deleted

## 2023-05-24 NOTE — Telephone Encounter (Signed)
  Chief Complaint: SOB Symptoms: SOB- exertion, unable to lay flat, cough, fever, fatigue Frequency: 10 days- but worse 5 days  Disposition: [x] ED /[] Urgent Care (no appt availability in office) / [] Appointment(In office/virtual)/ []  [facility]/ [] Home Care/ [] Refused Recommended Disposition /[] [facility]/ []  Follow-up with PCP Additional Notes: Patient advised ED for evaluation of symptoms- no PCP and significant changes in status since last evaluated.

## 2023-05-24 NOTE — Telephone Encounter (Signed)
Reason for Disposition  [1] MODERATE difficulty breathing (e.g., speaks in phrases, SOB even at rest, pulse 100-120) AND [2] NEW-onset or WORSE than normal  Answer Assessment - Initial Assessment Questions 1. RESPIRATORY STATUS: "Describe your breathing?" (e.g., wheezing, shortness of breath, unable to speak, severe coughing)      Unable to breath laying down 2. ONSET: "When did this breathing problem begin?"      Started 10 days ago- 5 days ago worse 3. PATTERN "Does the difficult breathing come and go, or has it been constant since it started?"      constant 4. SEVERITY: "How bad is your breathing?" (e.g., mild, moderate, severe)    - MILD: No SOB at rest, mild SOB with walking, speaks normally in sentences, can lie down, no retractions, pulse < 100.    - MODERATE: SOB at rest, SOB with minimal exertion and prefers to sit, cannot lie down flat, speaks in phrases, mild retractions, audible wheezing, pulse 100-120.    - SEVERE: Very SOB at rest, speaks in single words, struggling to breathe, sitting hunched forward, retractions, pulse > 120      moderate 5. RECURRENT SYMPTOM: "Have you had difficulty breathing before?" If Yes, ask: "When was the last time?" and "What happened that time?"      no 6. CARDIAC HISTORY: "Do you have any history of heart disease?" (e.g., heart attack, angina, bypass surgery, angioplasty)      Hx mitral value prolapse 7. LUNG HISTORY: "Do you have any history of lung disease?"  (e.g., pulmonary embolus, asthma, emphysema)     no 8. CAUSE: "What do you think is causing the breathing problem?"      COVID/flu- possible exposure to COVID 9. OTHER SYMPTOMS: "Do you have any other symptoms? (e.g., dizziness, runny nose, cough, chest pain, fever)     Fever-101, fatigue,cough-occasional productive  Protocols used: Breathing Difficulty-A-AH

## 2023-07-29 ENCOUNTER — Ambulatory Visit: Payer: MEDICAID | Admitting: Family Medicine

## 2023-08-13 ENCOUNTER — Ambulatory Visit: Payer: MEDICAID | Admitting: Family

## 2023-08-30 ENCOUNTER — Ambulatory Visit: Payer: MEDICAID | Admitting: Physician Assistant

## 2023-09-22 ENCOUNTER — Encounter (HOSPITAL_COMMUNITY): Payer: Self-pay

## 2023-09-22 ENCOUNTER — Other Ambulatory Visit: Payer: Self-pay

## 2023-09-22 ENCOUNTER — Emergency Department (HOSPITAL_COMMUNITY): Payer: MEDICAID

## 2023-09-22 ENCOUNTER — Inpatient Hospital Stay (HOSPITAL_COMMUNITY)
Admission: EM | Admit: 2023-09-22 | Discharge: 2023-09-25 | DRG: 064 | Disposition: A | Discharge status: Home / Self Care | Payer: MEDICAID | Attending: Internal Medicine | Admitting: Internal Medicine

## 2023-09-22 DIAGNOSIS — Z5971 Insufficient health insurance coverage: Secondary | ICD-10-CM

## 2023-09-22 DIAGNOSIS — R29702 NIHSS score 2: Secondary | ICD-10-CM | POA: Diagnosis present

## 2023-09-22 DIAGNOSIS — Z79899 Other long term (current) drug therapy: Secondary | ICD-10-CM

## 2023-09-22 DIAGNOSIS — Z9889 Other specified postprocedural states: Secondary | ICD-10-CM

## 2023-09-22 DIAGNOSIS — Z7902 Long term (current) use of antithrombotics/antiplatelets: Secondary | ICD-10-CM

## 2023-09-22 DIAGNOSIS — R1310 Dysphagia, unspecified: Secondary | ICD-10-CM | POA: Diagnosis not present

## 2023-09-22 DIAGNOSIS — R7303 Prediabetes: Secondary | ICD-10-CM | POA: Diagnosis present

## 2023-09-22 DIAGNOSIS — I358 Other nonrheumatic aortic valve disorders: Secondary | ICD-10-CM | POA: Diagnosis present

## 2023-09-22 DIAGNOSIS — G479 Sleep disorder, unspecified: Secondary | ICD-10-CM | POA: Diagnosis present

## 2023-09-22 DIAGNOSIS — R45851 Suicidal ideations: Secondary | ICD-10-CM

## 2023-09-22 DIAGNOSIS — D72829 Elevated white blood cell count, unspecified: Secondary | ICD-10-CM | POA: Diagnosis present

## 2023-09-22 DIAGNOSIS — F17210 Nicotine dependence, cigarettes, uncomplicated: Secondary | ICD-10-CM | POA: Diagnosis present

## 2023-09-22 DIAGNOSIS — I10 Essential (primary) hypertension: Secondary | ICD-10-CM | POA: Diagnosis present

## 2023-09-22 DIAGNOSIS — I6930 Unspecified sequelae of cerebral infarction: Secondary | ICD-10-CM | POA: Diagnosis present

## 2023-09-22 DIAGNOSIS — R233 Spontaneous ecchymoses: Secondary | ICD-10-CM | POA: Diagnosis present

## 2023-09-22 DIAGNOSIS — I639 Cerebral infarction, unspecified: Secondary | ICD-10-CM | POA: Diagnosis present

## 2023-09-22 DIAGNOSIS — F332 Major depressive disorder, recurrent severe without psychotic features: Secondary | ICD-10-CM | POA: Diagnosis present

## 2023-09-22 DIAGNOSIS — Z87820 Personal history of traumatic brain injury: Secondary | ICD-10-CM

## 2023-09-22 DIAGNOSIS — I63443 Cerebral infarction due to embolism of bilateral cerebellar arteries: Secondary | ICD-10-CM | POA: Diagnosis present

## 2023-09-22 DIAGNOSIS — F1020 Alcohol dependence, uncomplicated: Secondary | ICD-10-CM | POA: Diagnosis present

## 2023-09-22 DIAGNOSIS — Z8249 Family history of ischemic heart disease and other diseases of the circulatory system: Secondary | ICD-10-CM

## 2023-09-22 DIAGNOSIS — I63012 Cerebral infarction due to thrombosis of left vertebral artery: Secondary | ICD-10-CM | POA: Diagnosis present

## 2023-09-22 DIAGNOSIS — Z716 Tobacco abuse counseling: Secondary | ICD-10-CM

## 2023-09-22 DIAGNOSIS — I6389 Other cerebral infarction: Secondary | ICD-10-CM | POA: Diagnosis present

## 2023-09-22 DIAGNOSIS — F419 Anxiety disorder, unspecified: Secondary | ICD-10-CM | POA: Diagnosis present

## 2023-09-22 DIAGNOSIS — H53461 Homonymous bilateral field defects, right side: Secondary | ICD-10-CM | POA: Diagnosis present

## 2023-09-22 DIAGNOSIS — Z9851 Tubal ligation status: Secondary | ICD-10-CM

## 2023-09-22 DIAGNOSIS — E785 Hyperlipidemia, unspecified: Secondary | ICD-10-CM | POA: Diagnosis present

## 2023-09-22 DIAGNOSIS — F17200 Nicotine dependence, unspecified, uncomplicated: Secondary | ICD-10-CM | POA: Diagnosis present

## 2023-09-22 DIAGNOSIS — I63432 Cerebral infarction due to embolism of left posterior cerebral artery: Principal | ICD-10-CM | POA: Diagnosis present

## 2023-09-22 DIAGNOSIS — R410 Disorientation, unspecified: Principal | ICD-10-CM

## 2023-09-22 DIAGNOSIS — Z833 Family history of diabetes mellitus: Secondary | ICD-10-CM

## 2023-09-22 DIAGNOSIS — H5461 Unqualified visual loss, right eye, normal vision left eye: Secondary | ICD-10-CM | POA: Diagnosis present

## 2023-09-22 DIAGNOSIS — I513 Intracardiac thrombosis, not elsewhere classified: Secondary | ICD-10-CM | POA: Diagnosis present

## 2023-09-22 DIAGNOSIS — Z7982 Long term (current) use of aspirin: Secondary | ICD-10-CM

## 2023-09-22 DIAGNOSIS — I6312 Cerebral infarction due to embolism of basilar artery: Secondary | ICD-10-CM | POA: Diagnosis present

## 2023-09-22 DIAGNOSIS — H539 Unspecified visual disturbance: Secondary | ICD-10-CM

## 2023-09-22 LAB — COMPREHENSIVE METABOLIC PANEL
ALT: 36 U/L (ref 0–44)
AST: 46 U/L — ABNORMAL HIGH (ref 15–41)
Albumin: 3.8 g/dL (ref 3.5–5.0)
Alkaline Phosphatase: 79 U/L (ref 38–126)
Anion gap: 12 (ref 5–15)
BUN: 19 mg/dL (ref 6–20)
CO2: 24 mmol/L (ref 22–32)
Calcium: 8.9 mg/dL (ref 8.9–10.3)
Chloride: 101 mmol/L (ref 98–111)
Creatinine, Ser: 0.77 mg/dL (ref 0.44–1.00)
GFR, Estimated: >60 mL/min (ref >60)
Glucose, Bld: 133 mg/dL — ABNORMAL HIGH (ref 70–99)
Potassium: 3.5 mmol/L (ref 3.5–5.1)
Sodium: 137 mmol/L (ref 135–145)
Total Bilirubin: 0.8 mg/dL (ref 0.0–1.2)
Total Protein: 6.9 g/dL (ref 6.5–8.1)

## 2023-09-22 LAB — I-STAT CHEM 8, ED
BUN: 19 mg/dL (ref 6–20)
Calcium, Ion: 1.08 mmol/L — ABNORMAL LOW (ref 1.15–1.40)
Chloride: 103 mmol/L (ref 98–111)
Creatinine, Ser: 0.8 mg/dL (ref 0.44–1.00)
Glucose, Bld: 131 mg/dL — ABNORMAL HIGH (ref 70–99)
HCT: 44 % (ref 36.0–46.0)
Hemoglobin: 15 g/dL (ref 12.0–15.0)
Potassium: 3.8 mmol/L (ref 3.5–5.1)
Sodium: 138 mmol/L (ref 135–145)
TCO2: 25 mmol/L (ref 22–32)

## 2023-09-22 LAB — DIFFERENTIAL
Abs Immature Granulocytes: 0.06 10*3/uL (ref 0.00–0.07)
Basophils Absolute: 0.1 10*3/uL (ref 0.0–0.1)
Basophils Relative: 1 %
Eosinophils Absolute: 0 10*3/uL (ref 0.0–0.5)
Eosinophils Relative: 0 %
Immature Granulocytes: 1 %
Lymphocytes Relative: 19 %
Lymphs Abs: 2.3 10*3/uL (ref 0.7–4.0)
Monocytes Absolute: 1.2 10*3/uL — ABNORMAL HIGH (ref 0.1–1.0)
Monocytes Relative: 10 %
Neutro Abs: 8.5 10*3/uL — ABNORMAL HIGH (ref 1.7–7.7)
Neutrophils Relative %: 69 %

## 2023-09-22 LAB — CBC
HCT: 44 % (ref 36.0–46.0)
Hemoglobin: 14.6 g/dL (ref 12.0–15.0)
MCH: 32.1 pg (ref 26.0–34.0)
MCHC: 33.2 g/dL (ref 30.0–36.0)
MCV: 96.7 fL (ref 80.0–100.0)
Platelets: 281 10*3/uL (ref 150–400)
RBC: 4.55 MIL/uL (ref 3.87–5.11)
RDW: 14.1 % (ref 11.5–15.5)
WBC: 12.1 10*3/uL — ABNORMAL HIGH (ref 4.0–10.5)
nRBC: 0 % (ref 0.0–0.2)

## 2023-09-22 LAB — PROTIME-INR
INR: 1 (ref 0.8–1.2)
Prothrombin Time: 13.3 s (ref 11.4–15.2)

## 2023-09-22 LAB — ETHANOL: Alcohol, Ethyl (B): <10 mg/dL (ref <10)

## 2023-09-22 LAB — HCG, SERUM, QUALITATIVE: Preg, Serum: NEGATIVE

## 2023-09-22 LAB — MAGNESIUM: Magnesium: 2.2 mg/dL (ref 1.7–2.4)

## 2023-09-22 NOTE — ED Provider Notes (Addendum)
[place] EMERGENCY DEPARTMENT AT [facility] - Inpatient Provider Note   CSN: [id number] Arrival date & time: 09/22/23  2138     History  Chief Complaint  Patient presents with   Altered Mental Status    Kristen Rowland is a 53 y.o. female.   Altered Mental Status Presenting symptoms: confusion   Patient presents for altered mental status.  Medical history includes alcohol use disorder, SAH, depression.  EMS was called to her home by boyfriend.  There was concern of possible suicide attempt.  When questioned about this, patient denies.  She states that she did drink a large amount of alcohol 3 days ago.  Recent alcohol use has been intermittent.  She has not drank alcohol in the past 3 days.  She states that she also has not been eating.  When asked why, she says she "forgot how to".  She has also had visual disturbance with loss of peripheral vision on right side.  She is concerned that she had a stroke.  She denies any recent or current suicidal ideation or attempt of self-harm.  She does not take any medications.  She denies any ingestions other than alcohol.  She does not use drugs.  Currently, she denies any areas of pain.  History per significant other: Significant other's brother has recently been staying with them following surgery.  Significant other feels that patient grew jealous of the attention he was getting.  She got intoxicated 3 nights ago and "raised hell".  Since that time, she has been very docile and has had decreased activity.  Significant other's brother noticed missing oxycodone and feels the patient may have taken it.  Patient has repeatedly been saying "I feel sick" and "I had a stroke".  Significant other states that this is all she has been saying for the past 2 days.  He does not think she had a suicide attempt.     Home Medications Prior to Admission medications   Medication Sig Start Date End Date Taking? Authorizing Provider  busPIRone (BUSPAR) 10 MG  tablet Take 1 tablet (10 mg total) by mouth 2 (two) times daily. For anxiety 03/18/18   Armandina Stammer I, NP  hydrOXYzine (ATARAX/VISTARIL) 25 MG tablet Take 1 tablet (25 mg total) by mouth every 6 (six) hours as needed for anxiety (or CIWA score </= 10). 03/18/18   Starkes-Perry, Juel Burrow, FNP  traZODone (DESYREL) 50 MG tablet Take 1 tablet (50 mg total) by mouth at bedtime and may repeat dose one time if needed. 03/18/18   Maryagnes Amos, FNP      Allergies    Patient has no known allergies.    Review of Systems   Review of Systems  Eyes:  Positive for visual disturbance.  Psychiatric/Behavioral:  Positive for confusion.   All other systems reviewed and are negative.   Physical Exam Updated Vital Signs BP (!) 168/105 (BP Location: Left Arm)   Pulse 76   Temp 98.5 F (36.9 C) (Oral)   Resp 18   LMP  (LMP Unknown)   SpO2 98%  Physical Exam Vitals and nursing note reviewed.  Constitutional:      General: She is not in acute distress.    Appearance: Normal appearance. She is well-developed. She is not ill-appearing, toxic-appearing or diaphoretic.  HENT:     Head: Normocephalic and atraumatic.     Right Ear: External ear normal.     Left Ear: External ear normal.     Nose: Nose  normal.     Mouth/Throat:     Mouth: Mucous membranes are moist.  Eyes:     General: Visual field deficit present.     Extraocular Movements: Extraocular movements intact.     Conjunctiva/sclera: Conjunctivae normal.  Cardiovascular:     Rate and Rhythm: Normal rate and regular rhythm.     Heart sounds: No murmur heard. Pulmonary:     Effort: Pulmonary effort is normal. No respiratory distress.     Breath sounds: Normal breath sounds. No wheezing or rales.  Chest:     Chest wall: No tenderness.  Abdominal:     General: There is no distension.     Palpations: Abdomen is soft.     Tenderness: There is no abdominal tenderness.  Musculoskeletal:        General: No swelling. Normal range of  motion.     Cervical back: Normal range of motion and neck supple.  Skin:    General: Skin is warm and dry.     Coloration: Skin is not jaundiced or pale.  Neurological:     Mental Status: She is alert and oriented to person, place, and time.     Cranial Nerves: No dysarthria or facial asymmetry.     Sensory: Sensation is intact. No sensory deficit.     Motor: Motor function is intact. No weakness, abnormal muscle tone or pronator drift.     Coordination: Coordination is intact. Coordination normal. Finger-Nose-Finger Test normal.  Psychiatric:        Mood and Affect: Mood normal.        Behavior: Behavior normal.     ED Results / Procedures / Treatments   Labs (all labs ordered are listed, but only abnormal results are displayed) Labs Reviewed  CBC - Abnormal; Notable for the following components:      Result Value   WBC 12.1 (*)    All other components within normal limits  DIFFERENTIAL - Abnormal; Notable for the following components:   Neutro Abs 8.5 (*)    Monocytes Absolute 1.2 (*)    All other components within normal limits  COMPREHENSIVE METABOLIC PANEL - Abnormal; Notable for the following components:   Glucose, Bld 133 (*)    AST 46 (*)    All other components within normal limits  I-STAT CHEM 8, ED - Abnormal; Notable for the following components:   Glucose, Bld 131 (*)    Calcium, Ion 1.08 (*)    All other components within normal limits  PROTIME-INR  ETHANOL  HCG, SERUM, QUALITATIVE  MAGNESIUM  RAPID URINE DRUG SCREEN, HOSP PERFORMED  URINALYSIS, ROUTINE W REFLEX MICROSCOPIC  ACETAMINOPHEN LEVEL  SALICYLATE LEVEL    EKG EKG Interpretation Date/Time:  Wednesday September 22 2023 21:54:45 EST Ventricular Rate:  69 PR Interval:  121 QRS Duration:  86 QT Interval:  416 QTC Calculation: 446 R Axis:   26  Text Interpretation: Sinus rhythm Confirmed by Gloris Manchester ([id number]) on 09/22/2023 10:07:24 PM  Radiology No results found.  Procedures Procedures     Medications Ordered in ED Medications  iohexol (OMNIPAQUE) 350 MG/ML injection 100 mL (has no administration in time range)    ED Course/ Medical Decision Making/ A&P                                 Medical Decision Making Amount and/or Complexity of Data Reviewed Labs: ordered. Radiology: ordered.   This patient presents  to the ED for concern of altered mental status, this involves an extensive number of treatment options, and is a complaint that carries with it a high risk of complications and morbidity.  The differential diagnosis includes intoxication, alcohol withdrawal, ingestion, CVA, seizure, ICH, neoplasm, metabolic derangements  Co morbidities that complicate the patient evaluation  alcohol use disorder, SAH, depression   Additional history obtained:  Additional history obtained from N/A External records from outside source obtained and reviewed including EMR   Lab Tests:  I Ordered, and personally interpreted labs.  The pertinent results include: Normal kidney function, normal electrolytes, mild leukocytosis, normal hemoglobin   Imaging Studies ordered:  I ordered imaging studies including CTA head and neck, MRI brain I independently visualized and interpreted imaging which showed (pending at time of signout) I agree with the radiologist interpretation   Cardiac Monitoring: / EKG:  The patient was maintained on a cardiac monitor.  I personally viewed and interpreted the cardiac monitored which showed an underlying rhythm of: Sinus rhythm  Problem List / ED Course / Critical interventions / Medication management  Patient presenting for altered mental status for the past 2 days.  History obtained by patient and significant other.  Although there was initial concern with EMS and possible suicide attempt, both patient and significant other deny this.  On exam, she is alert and oriented.  She does seem to have right-sided peripheral vision loss.  I do not  appreciate any areas of focal weakness.  Patient denies any areas of diminished sensation.  She has no tremor.  She has no asterixis.  I do not suspect alcohol withdrawal.  I am concerned of possible subacute stroke.  Workup was initiated.  Initial lab work is notable only for mild leukocytosis.  MRI shows posterior strokes.  I discussed this with neurologist on-call, Dr. Derry Lory, who advises admission to [facility].  There is concern of basilar thrombus.  CTA was pending at time of signout.  Care of patient was signed out to oncoming ED provider.   Social Determinants of Health:  Ongoing alcohol abuse        Final Clinical Impression(s) / ED Diagnoses Final diagnoses:  Confusion  Cerebrovascular accident (CVA), unspecified mechanism (HCC)    Rx / DC Orders ED Discharge Orders     None         Gloris Manchester, MD 09/22/23 2354    Gloris Manchester, MD 09/23/23 0000    Gloris Manchester, MD 09/23/23 [phone]

## 2023-09-22 NOTE — ED Triage Notes (Signed)
Arrives [facility] from home after boyfriend made contact with 911.   Had previously been excessively drinking at an attempt to kill herself 3 days ago. Afterwards 3 days ago she noticed she had no peripheral vision on right side and confusion.   Pt says she has poor coordination, has forgot how to use the bathroom or charge phone or eat.   Currently denies suicidal ideation  [facility]-14.

## 2023-09-23 ENCOUNTER — Emergency Department (HOSPITAL_COMMUNITY): Payer: MEDICAID

## 2023-09-23 ENCOUNTER — Encounter (HOSPITAL_COMMUNITY): Payer: Self-pay | Admitting: Internal Medicine

## 2023-09-23 ENCOUNTER — Inpatient Hospital Stay (HOSPITAL_COMMUNITY): Payer: MEDICAID

## 2023-09-23 DIAGNOSIS — I6389 Other cerebral infarction: Secondary | ICD-10-CM | POA: Diagnosis present

## 2023-09-23 DIAGNOSIS — I6930 Unspecified sequelae of cerebral infarction: Secondary | ICD-10-CM | POA: Diagnosis present

## 2023-09-23 DIAGNOSIS — I63432 Cerebral infarction due to embolism of left posterior cerebral artery: Secondary | ICD-10-CM | POA: Diagnosis present

## 2023-09-23 DIAGNOSIS — Z7902 Long term (current) use of antithrombotics/antiplatelets: Secondary | ICD-10-CM | POA: Diagnosis not present

## 2023-09-23 DIAGNOSIS — D72829 Elevated white blood cell count, unspecified: Secondary | ICD-10-CM | POA: Diagnosis present

## 2023-09-23 DIAGNOSIS — R7303 Prediabetes: Secondary | ICD-10-CM | POA: Diagnosis present

## 2023-09-23 DIAGNOSIS — R1310 Dysphagia, unspecified: Secondary | ICD-10-CM | POA: Diagnosis not present

## 2023-09-23 DIAGNOSIS — I358 Other nonrheumatic aortic valve disorders: Secondary | ICD-10-CM | POA: Diagnosis present

## 2023-09-23 DIAGNOSIS — H53461 Homonymous bilateral field defects, right side: Secondary | ICD-10-CM | POA: Diagnosis present

## 2023-09-23 DIAGNOSIS — I639 Cerebral infarction, unspecified: Secondary | ICD-10-CM

## 2023-09-23 DIAGNOSIS — F419 Anxiety disorder, unspecified: Secondary | ICD-10-CM | POA: Diagnosis present

## 2023-09-23 DIAGNOSIS — E785 Hyperlipidemia, unspecified: Secondary | ICD-10-CM | POA: Diagnosis present

## 2023-09-23 DIAGNOSIS — R233 Spontaneous ecchymoses: Secondary | ICD-10-CM | POA: Diagnosis present

## 2023-09-23 DIAGNOSIS — R29702 NIHSS score 2: Secondary | ICD-10-CM | POA: Diagnosis present

## 2023-09-23 DIAGNOSIS — I63443 Cerebral infarction due to embolism of bilateral cerebellar arteries: Secondary | ICD-10-CM | POA: Diagnosis present

## 2023-09-23 DIAGNOSIS — I63012 Cerebral infarction due to thrombosis of left vertebral artery: Secondary | ICD-10-CM | POA: Diagnosis present

## 2023-09-23 DIAGNOSIS — R45851 Suicidal ideations: Secondary | ICD-10-CM | POA: Diagnosis present

## 2023-09-23 DIAGNOSIS — F332 Major depressive disorder, recurrent severe without psychotic features: Secondary | ICD-10-CM | POA: Diagnosis present

## 2023-09-23 DIAGNOSIS — I6312 Cerebral infarction due to embolism of basilar artery: Secondary | ICD-10-CM | POA: Diagnosis present

## 2023-09-23 DIAGNOSIS — H5461 Unqualified visual loss, right eye, normal vision left eye: Secondary | ICD-10-CM | POA: Diagnosis present

## 2023-09-23 DIAGNOSIS — I513 Intracardiac thrombosis, not elsewhere classified: Secondary | ICD-10-CM | POA: Diagnosis present

## 2023-09-23 DIAGNOSIS — Z7982 Long term (current) use of aspirin: Secondary | ICD-10-CM | POA: Diagnosis not present

## 2023-09-23 DIAGNOSIS — F17200 Nicotine dependence, unspecified, uncomplicated: Secondary | ICD-10-CM | POA: Diagnosis not present

## 2023-09-23 DIAGNOSIS — R410 Disorientation, unspecified: Secondary | ICD-10-CM | POA: Diagnosis present

## 2023-09-23 DIAGNOSIS — F1020 Alcohol dependence, uncomplicated: Secondary | ICD-10-CM | POA: Diagnosis present

## 2023-09-23 DIAGNOSIS — I10 Essential (primary) hypertension: Secondary | ICD-10-CM | POA: Diagnosis present

## 2023-09-23 DIAGNOSIS — F17210 Nicotine dependence, cigarettes, uncomplicated: Secondary | ICD-10-CM | POA: Diagnosis present

## 2023-09-23 DIAGNOSIS — G479 Sleep disorder, unspecified: Secondary | ICD-10-CM | POA: Diagnosis present

## 2023-09-23 LAB — LIPID PANEL
Cholesterol: 228 mg/dL — ABNORMAL HIGH (ref 0–200)
HDL: 35 mg/dL — ABNORMAL LOW (ref >40)
LDL Cholesterol: 152 mg/dL — ABNORMAL HIGH (ref 0–99)
Total CHOL/HDL Ratio: 6.5 {ratio}
Triglycerides: 206 mg/dL — ABNORMAL HIGH (ref <150)
VLDL: 41 mg/dL — ABNORMAL HIGH (ref 0–40)

## 2023-09-23 LAB — RAPID URINE DRUG SCREEN, HOSP PERFORMED
Amphetamines: NOT DETECTED
Barbiturates: NOT DETECTED
Benzodiazepines: NOT DETECTED
Cocaine: NOT DETECTED
Opiates: NOT DETECTED
Tetrahydrocannabinol: NOT DETECTED

## 2023-09-23 LAB — URINALYSIS, ROUTINE W REFLEX MICROSCOPIC
Bacteria, UA: NONE SEEN
Bilirubin Urine: NEGATIVE
Glucose, UA: NEGATIVE mg/dL
Ketones, ur: NEGATIVE mg/dL
Leukocytes,Ua: NEGATIVE
Nitrite: NEGATIVE
Protein, ur: NEGATIVE mg/dL
Specific Gravity, Urine: 1.023 (ref 1.005–1.030)
pH: 6 (ref 5.0–8.0)

## 2023-09-23 LAB — CBC
HCT: 42.8 % (ref 36.0–46.0)
Hemoglobin: 14.5 g/dL (ref 12.0–15.0)
MCH: 32.3 pg (ref 26.0–34.0)
MCHC: 33.9 g/dL (ref 30.0–36.0)
MCV: 95.3 fL (ref 80.0–100.0)
Platelets: 270 10*3/uL (ref 150–400)
RBC: 4.49 MIL/uL (ref 3.87–5.11)
RDW: 13.8 % (ref 11.5–15.5)
WBC: 11.5 10*3/uL — ABNORMAL HIGH (ref 4.0–10.5)
nRBC: 0 % (ref 0.0–0.2)

## 2023-09-23 LAB — CREATININE, SERUM
Creatinine, Ser: 0.74 mg/dL (ref 0.44–1.00)
GFR, Estimated: >60 mL/min (ref >60)

## 2023-09-23 LAB — SALICYLATE LEVEL: Salicylate Lvl: <7 mg/dL — ABNORMAL LOW (ref 7.0–30.0)

## 2023-09-23 LAB — ECHOCARDIOGRAM COMPLETE
AR max vel: 3.05 cm2
AV Area VTI: 2.62 cm2
AV Area mean vel: 2.49 cm2
AV Mean grad: 3 mm[Hg]
AV Peak grad: 5.6 mm[Hg]
Ao pk vel: 1.18 m/s
Area-P 1/2: 5.27 cm2
Calc EF: 70.1 %
S' Lateral: 2.7 cm
Single Plane A2C EF: 70.4 %
Single Plane A4C EF: 68.2 %

## 2023-09-23 LAB — HIV ANTIBODY (ROUTINE TESTING W REFLEX): HIV Screen 4th Generation wRfx: NONREACTIVE

## 2023-09-23 LAB — HEMOGLOBIN A1C
Hgb A1c MFr Bld: 6.5 % — ABNORMAL HIGH (ref 4.8–5.6)
Mean Plasma Glucose: 139.85 mg/dL

## 2023-09-23 LAB — ACETAMINOPHEN LEVEL: Acetaminophen (Tylenol), Serum: <10 ug/mL — ABNORMAL LOW (ref 10–30)

## 2023-09-23 MED ORDER — ENOXAPARIN SODIUM 40 MG/0.4ML IJ SOSY
40.0000 mg | PREFILLED_SYRINGE | INTRAMUSCULAR | Status: DC
  Administered 2023-09-23 – 2023-09-24 (×2): 40 mg via SUBCUTANEOUS
  Filled 2023-09-23 (×2): qty 0.4

## 2023-09-23 MED ORDER — CLOPIDOGREL BISULFATE 75 MG PO TABS
75.0000 mg | ORAL_TABLET | Freq: Every day | ORAL | Status: DC
Start: 2023-09-23 — End: 2023-09-24
  Administered 2023-09-23 – 2023-09-24 (×2): 75 mg via ORAL
  Filled 2023-09-23 (×2): qty 1

## 2023-09-23 MED ORDER — ASPIRIN 81 MG PO TBEC
81.0000 mg | DELAYED_RELEASE_TABLET | Freq: Every day | ORAL | Status: DC
  Administered 2023-09-23 – 2023-09-24 (×2): 81 mg via ORAL
  Filled 2023-09-23 (×2): qty 1

## 2023-09-23 MED ORDER — ACETAMINOPHEN 650 MG RE SUPP: 650.0000 mg | RECTAL | Status: DC | PRN

## 2023-09-23 MED ORDER — TRAMADOL HCL 50 MG PO TABS
50.0000 mg | ORAL_TABLET | Freq: Four times a day (QID) | ORAL | Status: DC | PRN
  Administered 2023-09-23 – 2023-09-25 (×3): 50 mg via ORAL
  Filled 2023-09-23 (×3): qty 1

## 2023-09-23 MED ORDER — ACETAMINOPHEN 160 MG/5ML PO SOLN: 650.0000 mg | ORAL | Status: DC | PRN

## 2023-09-23 MED ORDER — THIAMINE HCL 100 MG/ML IJ SOLN
100.0000 mg | INTRAMUSCULAR | Status: DC
  Administered 2023-09-23 – 2023-09-24 (×2): 100 mg via INTRAVENOUS
  Filled 2023-09-23 (×2): qty 2

## 2023-09-23 MED ORDER — ACETAMINOPHEN 325 MG PO TABS
650.0000 mg | ORAL_TABLET | ORAL | Status: DC | PRN
  Administered 2023-09-23 (×2): 650 mg via ORAL
  Filled 2023-09-23 (×2): qty 2

## 2023-09-23 MED ORDER — ATORVASTATIN CALCIUM 80 MG PO TABS
80.0000 mg | ORAL_TABLET | Freq: Every day | ORAL | Status: DC
  Administered 2023-09-23 – 2023-09-25 (×3): 80 mg via ORAL
  Filled 2023-09-23: qty 2
  Filled 2023-09-23 (×2): qty 1

## 2023-09-23 MED ORDER — ACETAMINOPHEN 325 MG PO TABS: 650.0000 mg | ORAL_TABLET | ORAL | Status: DC | PRN

## 2023-09-23 MED ORDER — SODIUM CHLORIDE 0.9 % IV SOLN: INTRAVENOUS | Status: AC

## 2023-09-23 MED ORDER — IOHEXOL 350 MG/ML SOLN
100.0000 mL | Freq: Once | INTRAVENOUS | Status: AC | PRN
  Administered 2023-09-23: 100 mL via INTRAVENOUS

## 2023-09-23 MED ORDER — STROKE: EARLY STAGES OF RECOVERY BOOK
Freq: Once | Status: AC
  Filled 2023-09-23: qty 1

## 2023-09-23 NOTE — ED Notes (Signed)
Report called to [facility] on 3W

## 2023-09-23 NOTE — Progress Notes (Signed)
PT Cancellation Note  Patient Details Name: Kristen Rowland MRN: [id number] DOB: August 22, 1971   Cancelled Treatment:    Reason Eval/Treat Not Completed: Patient not medically ready Patient to transfer to [facility] for neurology.  Blanchard Kelch PT Acute Rehabilitation Services Office [phone] Weekend [phone]   Rada Hay 09/23/2023, 7:39 AM

## 2023-09-23 NOTE — ED Notes (Signed)
[facility] called.
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
## 2023-09-23 NOTE — ED Provider Notes (Signed)
I assumed care at signout the call report.  Discussed the case with Dr. Toniann Fail for admission  Dr. Derry Lory with neuro is aware of patient, he has reviewed all imaging recommends medical admission   Zadie Rhine, MD 09/23/23 0120

## 2023-09-23 NOTE — Progress Notes (Signed)
OT Cancellation Note  Patient Details Name: Kristen Rowland MRN: [id number] DOB: 07/29/1971   Cancelled Treatment:    Reason Eval/Treat Not Completed: Patient not medically ready. Pt to transfer to [facility] for further observation and treatment.  Lindon Romp OT Acute Rehabilitation Services Office [phone]    Evette Georges 09/23/2023, 7:42 AM

## 2023-09-23 NOTE — ED Provider Notes (Incomplete)
[place] EMERGENCY DEPARTMENT AT [facility] Provider Note   CSN: [id number] Arrival date & time: 09/22/23  2138     History  Chief Complaint  Patient presents with  . Altered Mental Status    Kristen Rowland is a 53 y.o. female.   Altered Mental Status Presenting symptoms: confusion   Patient presents for altered mental status.  Medical history includes alcohol use disorder, SAH, depression.  EMS was called to her home by boyfriend.  There was concern of possible suicide attempt.  When questioned about this, patient denies.  She states that she did drink a large amount of alcohol 3 days ago.  Recent alcohol use has been intermittent.  She has not drank alcohol in the past 3 days.  She states that she also has not been eating.  When asked why, she says she "forgot how to".  She has also had visual disturbance with loss of peripheral vision on right side.  She is concerned that she had a stroke.  She denies any recent or current suicidal ideation or attempt of self-harm.  She does not take any medications.  She denies any ingestions other than alcohol.  She does not use drugs.  Currently, she denies any areas of pain.  History per significant other: Significant other's brother has recently been staying with them following surgery.  Significant other feels that patient grew jealous of the attention he was getting.  She got intoxicated 3 nights ago and "raised hell".  Since that time, she has been very docile and has had decreased activity.  Significant other's brother noticed missing oxycodone and feels the patient may have taken it.  Patient has repeatedly been saying "I feel sick" and "I had a stroke".  Significant other states that this is all she has been saying for the past 2 days.  He does not think she had a suicide attempt.     Home Medications Prior to Admission medications   Medication Sig Start Date End Date Taking? Authorizing Provider  busPIRone (BUSPAR) 10 MG  tablet Take 1 tablet (10 mg total) by mouth 2 (two) times daily. For anxiety 03/18/18   Armandina Stammer I, NP  hydrOXYzine (ATARAX/VISTARIL) 25 MG tablet Take 1 tablet (25 mg total) by mouth every 6 (six) hours as needed for anxiety (or CIWA score </= 10). 03/18/18   Starkes-Perry, Juel Burrow, FNP  traZODone (DESYREL) 50 MG tablet Take 1 tablet (50 mg total) by mouth at bedtime and may repeat dose one time if needed. 03/18/18   Maryagnes Amos, FNP      Allergies    Patient has no known allergies.    Review of Systems   Review of Systems  Eyes:  Positive for visual disturbance.  Psychiatric/Behavioral:  Positive for confusion.   All other systems reviewed and are negative.   Physical Exam Updated Vital Signs BP (!) 168/105 (BP Location: Left Arm)   Pulse 76   Temp 98.5 F (36.9 C) (Oral)   Resp 18   LMP  (LMP Unknown)   SpO2 98%  Physical Exam Vitals and nursing note reviewed.  Constitutional:      General: She is not in acute distress.    Appearance: Normal appearance. She is well-developed. She is not ill-appearing, toxic-appearing or diaphoretic.  HENT:     Head: Normocephalic and atraumatic.     Right Ear: External ear normal.     Left Ear: External ear normal.     Nose: Nose  normal.     Mouth/Throat:     Mouth: Mucous membranes are moist.  Eyes:     General: Visual field deficit present.     Extraocular Movements: Extraocular movements intact.     Conjunctiva/sclera: Conjunctivae normal.  Cardiovascular:     Rate and Rhythm: Normal rate and regular rhythm.     Heart sounds: No murmur heard. Pulmonary:     Effort: Pulmonary effort is normal. No respiratory distress.     Breath sounds: Normal breath sounds. No wheezing or rales.  Chest:     Chest wall: No tenderness.  Abdominal:     General: There is no distension.     Palpations: Abdomen is soft.     Tenderness: There is no abdominal tenderness.  Musculoskeletal:        General: No swelling. Normal range of  motion.     Cervical back: Normal range of motion and neck supple.  Skin:    General: Skin is warm and dry.     Coloration: Skin is not jaundiced or pale.  Neurological:     Mental Status: She is alert and oriented to person, place, and time.     Cranial Nerves: No dysarthria or facial asymmetry.     Sensory: Sensation is intact. No sensory deficit.     Motor: Motor function is intact. No weakness, abnormal muscle tone or pronator drift.     Coordination: Coordination is intact. Coordination normal. Finger-Nose-Finger Test normal.  Psychiatric:        Mood and Affect: Mood normal.        Behavior: Behavior normal.     ED Results / Procedures / Treatments   Labs (all labs ordered are listed, but only abnormal results are displayed) Labs Reviewed  CBC - Abnormal; Notable for the following components:      Result Value   WBC 12.1 (*)    All other components within normal limits  DIFFERENTIAL - Abnormal; Notable for the following components:   Neutro Abs 8.5 (*)    Monocytes Absolute 1.2 (*)    All other components within normal limits  COMPREHENSIVE METABOLIC PANEL - Abnormal; Notable for the following components:   Glucose, Bld 133 (*)    AST 46 (*)    All other components within normal limits  I-STAT CHEM 8, ED - Abnormal; Notable for the following components:   Glucose, Bld 131 (*)    Calcium, Ion 1.08 (*)    All other components within normal limits  PROTIME-INR  ETHANOL  HCG, SERUM, QUALITATIVE  MAGNESIUM  RAPID URINE DRUG SCREEN, HOSP PERFORMED  URINALYSIS, ROUTINE W REFLEX MICROSCOPIC  ACETAMINOPHEN LEVEL  SALICYLATE LEVEL    EKG EKG Interpretation Date/Time:  Wednesday September 22 2023 21:54:45 EST Ventricular Rate:  69 PR Interval:  121 QRS Duration:  86 QT Interval:  416 QTC Calculation: 446 R Axis:   26  Text Interpretation: Sinus rhythm Confirmed by Gloris Manchester ([id number]) on 09/22/2023 10:07:24 PM  Radiology No results found.  Procedures Procedures     Medications Ordered in ED Medications - No data to display  ED Course/ Medical Decision Making/ A&P                                 Medical Decision Making Amount and/or Complexity of Data Reviewed Labs: ordered. Radiology: ordered.   This patient presents to the ED for concern of altered mental status, this involves  an extensive number of treatment options, and is a complaint that carries with it a high risk of complications and morbidity.  The differential diagnosis includes intoxication, alcohol withdrawal, ingestion, CVA, seizure, ICH, neoplasm, metabolic derangements  Co morbidities that complicate the patient evaluation  alcohol use disorder, SAH, depression   Additional history obtained:  Additional history obtained from N/A External records from outside source obtained and reviewed including EMR   Lab Tests:  I Ordered, and personally interpreted labs.  The pertinent results include: Normal kidney function, normal electrolytes, mild leukocytosis, normal hemoglobin   Imaging Studies ordered:  I ordered imaging studies including CTA head and neck, MRI brain I independently visualized and interpreted imaging which showed (pending at time of signout) I agree with the radiologist interpretation   Cardiac Monitoring: / EKG:  The patient was maintained on a cardiac monitor.  I personally viewed and interpreted the cardiac monitored which showed an underlying rhythm of: Sinus rhythm  Problem List / ED Course / Critical interventions / Medication management  Patient presenting for altered mental status for the past 2 days.  History obtained by patient and significant other.  Although there was initial concern with EMS and possible suicide attempt, both patient and significant other deny this.  On exam, she is alert and oriented.  She does seem to have right-sided peripheral vision loss.  I do not appreciate any areas of focal weakness.  Patient denies any areas of  diminished sensation.  She has no tremor.  She has no asterixis.  I do not suspect alcohol withdrawal.  I am concerned of possible subacute stroke.  Workup was initiated.  Initial lab work is notable only for mild leukocytosis.  Imaging studies pending at time of signout.  Care of patient was signed out to oncoming ED provider.   Social Determinants of Health:  Ongoing alcohol abuse        Final Clinical Impression(s) / ED Diagnoses Final diagnoses:  Confusion    Rx / DC Orders ED Discharge Orders     None         Gloris Manchester, MD 09/22/23 2354    Gloris Manchester, MD 09/23/23 0000

## 2023-09-23 NOTE — Progress Notes (Signed)
[facility] PROGRESS NOTE  WYATT GALVAN (DOB: 12/06/1970) MVH:[id number]  Brief Narrative: Kristen Rowland is a 53 y.o. female who takes no medications who presented to the ED on 09/22/2023 with vision loss as well as alcohol abuse with suicidal intent. She reported right eye vision loss by which she means the right side of her vision became abnormal then lost. Work up included MRI of the brain which showed acute confluent left PCA territory infarcts with petechial hemorrhage and acute bilateral cerebellar infarct. Acute infarct in the right basal ganglia and right parietal lobe. 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 Aspirin was given after discussion with neurology, and the patient was admitted this morning from [facility] with plans to go to [facility] for stroke neurology evaluation and work up.   Subjective: Denies current suicidal ideation. Vision change is stable. Perhaps some right side weakness involving face but able to walk and feels speech is normal. Her boyfriend seemed to think she was faking symptoms.   Objective: BP 113/77   Pulse 74   Temp 98.3 F (36.8 C) (Oral)   Resp 17   LMP  (LMP Unknown)   SpO2 98%   Gen: No distress Pulm: Clear, nonlabored  CV: RRR, no MRG or edema GI: Soft, NT, ND, +BS  Neuro: Alert and oriented. Right visual field cut with normal pupillary responses and otherwise no significant focal deficits. Ext: Warm, no deformities Skin: No rashes, lesions or ulcers on visualized skin   Assessment & Plan: Acute CVA: Suspect embolic etiology given distribution superimposed on small vessel disease.  - Neurology consult appreciated.  - Repeat CT per neurology in 24 hours to confirm stability in petechial hemorrhage.  - Echo ordered w/bubble study - Risk/benefit considered Re: DAPT vs. anticoagulation in setting of petechial hemorrhage but also atheroembolic etiology/left vertebral artery  stenosis noted. Started on ASA/plavix for now pending further work up. Repeat head CT 1/31. - Continue cardiac monitoring while admitted, would need 30-day monitoring if no atrial fibrillation or other indication for anticoagulation is noted.  - Started high-intensity statin, atorvastatin 80mg , with LDL 152.  - Suggest establishing care with PCP for ongoing risk factor modification, including HbA1c 6.5%.  Depression: Appears to have situational component and no current SI. Will defer to psychiatry who is consulted and is aware of admission to [facility].  Tobacco use: Cessation counseling provided  Alcohol abuse: >72 hrs from last drink, no evidence of withdrawal at this time. Will monitor closely. Moderation/cessation counseling provided. UDS negative., EtOH negative.   Tyrone Nine, MD [facility] [email or web address] 09/23/2023, 12:40 PM

## 2023-09-23 NOTE — ED Notes (Signed)
Tried to call report to 3W

## 2023-09-23 NOTE — Progress Notes (Signed)
  Echocardiogram 2D Echocardiogram has been performed.  Ocie Doyne RDCS 09/23/2023, 3:17 PM
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
## 2023-09-23 NOTE — ED Notes (Signed)
Pt ambulatory to the restroom.

## 2023-09-24 ENCOUNTER — Inpatient Hospital Stay (HOSPITAL_COMMUNITY): Payer: MEDICAID

## 2023-09-24 ENCOUNTER — Encounter (HOSPITAL_COMMUNITY)
Admission: EM | Disposition: A | Discharge status: Home / Self Care | Payer: Self-pay | Source: Home / Self Care | Attending: Internal Medicine

## 2023-09-24 ENCOUNTER — Inpatient Hospital Stay (HOSPITAL_COMMUNITY): Payer: MEDICAID | Admitting: Anesthesiology

## 2023-09-24 ENCOUNTER — Other Ambulatory Visit (HOSPITAL_COMMUNITY): Payer: MEDICAID

## 2023-09-24 DIAGNOSIS — I639 Cerebral infarction, unspecified: Secondary | ICD-10-CM

## 2023-09-24 DIAGNOSIS — F1020 Alcohol dependence, uncomplicated: Secondary | ICD-10-CM | POA: Diagnosis not present

## 2023-09-24 DIAGNOSIS — F332 Major depressive disorder, recurrent severe without psychotic features: Secondary | ICD-10-CM

## 2023-09-24 DIAGNOSIS — F17200 Nicotine dependence, unspecified, uncomplicated: Secondary | ICD-10-CM

## 2023-09-24 HISTORY — PX: TRANSESOPHAGEAL ECHOCARDIOGRAM (CATH LAB): EP1270

## 2023-09-24 LAB — BASIC METABOLIC PANEL
Anion gap: 12 (ref 5–15)
BUN: 17 mg/dL (ref 6–20)
CO2: 23 mmol/L (ref 22–32)
Calcium: 8.7 mg/dL — ABNORMAL LOW (ref 8.9–10.3)
Chloride: 102 mmol/L (ref 98–111)
Creatinine, Ser: 0.82 mg/dL (ref 0.44–1.00)
GFR, Estimated: >60 mL/min (ref >60)
Glucose, Bld: 133 mg/dL — ABNORMAL HIGH (ref 70–99)
Potassium: 3.6 mmol/L (ref 3.5–5.1)
Sodium: 137 mmol/L (ref 135–145)

## 2023-09-24 LAB — ECHO TEE

## 2023-09-24 LAB — CBC
HCT: 42.9 % (ref 36.0–46.0)
Hemoglobin: 14.7 g/dL (ref 12.0–15.0)
MCH: 32.2 pg (ref 26.0–34.0)
MCHC: 34.3 g/dL (ref 30.0–36.0)
MCV: 94.1 fL (ref 80.0–100.0)
Platelets: 310 10*3/uL (ref 150–400)
RBC: 4.56 MIL/uL (ref 3.87–5.11)
RDW: 13.5 % (ref 11.5–15.5)
WBC: 9.5 10*3/uL (ref 4.0–10.5)
nRBC: 0 % (ref 0.0–0.2)

## 2023-09-24 LAB — MAGNESIUM: Magnesium: 2.1 mg/dL (ref 1.7–2.4)

## 2023-09-24 LAB — ANTITHROMBIN III: AntiThromb III Func: 119 % (ref 75–120)

## 2023-09-24 SURGERY — TRANSESOPHAGEAL ECHOCARDIOGRAM (TEE) (CATHLAB)
Anesthesia: Monitor Anesthesia Care

## 2023-09-24 MED ORDER — BUTALBITAL-APAP-CAFFEINE 50-325-40 MG PO TABS
1.0000 | ORAL_TABLET | Freq: Three times a day (TID) | ORAL | Status: DC | PRN
  Administered 2023-09-24 – 2023-09-25 (×2): 1 via ORAL
  Filled 2023-09-24 (×2): qty 1

## 2023-09-24 MED ORDER — PROPOFOL 500 MG/50ML IV EMUL
INTRAVENOUS | Status: DC | PRN
  Administered 2023-09-24: 100 ug/kg/min via INTRAVENOUS

## 2023-09-24 MED ORDER — ONDANSETRON HCL 4 MG/2ML IJ SOLN
INTRAMUSCULAR | Status: DC | PRN
  Administered 2023-09-24: 4 mg via INTRAVENOUS

## 2023-09-24 MED ORDER — LIDOCAINE 2% (20 MG/ML) 5 ML SYRINGE
INTRAMUSCULAR | Status: DC | PRN
  Administered 2023-09-24: 60 mg via INTRAVENOUS

## 2023-09-24 MED ORDER — SODIUM CHLORIDE 0.9 % IV SOLN: INTRAVENOUS | Status: DC

## 2023-09-24 MED ORDER — ACETAMINOPHEN 325 MG PO TABS: 650.0000 mg | ORAL_TABLET | ORAL | Status: DC | PRN

## 2023-09-24 MED ORDER — PROPOFOL 10 MG/ML IV BOLUS
INTRAVENOUS | Status: DC | PRN
  Administered 2023-09-24: 80 mg via INTRAVENOUS

## 2023-09-24 MED ORDER — APIXABAN 5 MG PO TABS
5.0000 mg | ORAL_TABLET | Freq: Two times a day (BID) | ORAL | Status: DC
  Administered 2023-09-24 – 2023-09-25 (×2): 5 mg via ORAL
  Filled 2023-09-24 (×2): qty 1

## 2023-09-24 MED ORDER — FOLIC ACID 1 MG PO TABS
1.0000 mg | ORAL_TABLET | Freq: Every day | ORAL | Status: DC
  Administered 2023-09-25: 1 mg via ORAL
  Filled 2023-09-24: qty 1

## 2023-09-24 MED ORDER — SODIUM CHLORIDE 0.9 % IV SOLN: INTRAVENOUS | Status: DC | PRN

## 2023-09-24 MED ORDER — PROSIGHT PO TABS
1.0000 | ORAL_TABLET | Freq: Every day | ORAL | Status: DC
  Administered 2023-09-25: 1 via ORAL
  Filled 2023-09-24: qty 1

## 2023-09-24 MED ORDER — LORAZEPAM 0.5 MG PO TABS
0.5000 mg | ORAL_TABLET | Freq: Four times a day (QID) | ORAL | Status: DC | PRN
  Administered 2023-09-24: 0.5 mg via ORAL
  Filled 2023-09-24: qty 1

## 2023-09-24 MED ORDER — THIAMINE MONONITRATE 100 MG PO TABS
100.0000 mg | ORAL_TABLET | Freq: Every day | ORAL | Status: DC
Start: 2023-09-25 — End: 2023-09-25
  Administered 2023-09-25: 100 mg via ORAL
  Filled 2023-09-24: qty 1
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
## 2023-09-24 NOTE — Anesthesia Preprocedure Evaluation (Addendum)
Anesthesia Evaluation  Patient identified by MRN, date of birth, ID band Patient awake    Reviewed: Allergy & Precautions, H&P , NPO status , Patient's Chart, lab work & pertinent test results  Airway Mallampati: III  TM Distance: >3 FB Neck ROM: Full    Dental no notable dental hx. (+) Teeth Intact, Dental Advisory Given   Pulmonary Current Smoker   Pulmonary exam normal breath sounds clear to auscultation       Cardiovascular negative cardio ROS  Rhythm:Regular Rate:Normal     Neuro/Psych    Depression    CVA    GI/Hepatic negative GI ROS, Neg liver ROS,,,  Endo/Other  negative endocrine ROS    Renal/GU negative Renal ROS  negative genitourinary   Musculoskeletal   Abdominal   Peds  Hematology negative hematology ROS (+)   Anesthesia Other Findings   Reproductive/Obstetrics negative OB ROS                             Anesthesia Physical Anesthesia Plan  ASA: 3  Anesthesia Plan: MAC   Post-op Pain Management: Minimal or no pain anticipated   Induction: Intravenous  PONV Risk Score and Plan: 1 and Propofol infusion  Airway Management Planned: Natural Airway and Simple Face Mask  Additional Equipment:   Intra-op Plan:   Post-operative Plan:   Informed Consent: I have reviewed the patients History and Physical, chart, labs and discussed the procedure including the risks, benefits and alternatives for the proposed anesthesia with the patient or authorized representative who has indicated his/her understanding and acceptance.     Dental advisory given  Plan Discussed with: CRNA  Anesthesia Plan Comments:        Anesthesia Quick Evaluation

## 2023-09-24 NOTE — Progress Notes (Signed)
SLP Cancellation Note  Patient Details Name: Kristen Rowland MRN: [id number] DOB: 1970-12-01   Cancelled treatment:       Reason Eval/Treat Not Completed: Patient at procedure or test/unavailable.  Pt is currently OTF for procedure.  SLP will f/u for cognitive-linguistic evaluation as appropriate and as schedule allows.    [name] 09/24/2023, 9:59 AM

## 2023-09-24 NOTE — Interval H&P Note (Signed)
History and Physical Interval Note:  09/24/2023 9:04 AM  Kristen Rowland  has presented today for surgery, with the diagnosis of cryptogenic stroke.  The various methods of treatment have been discussed with the patient and family. After consideration of risks, benefits and other options for treatment, the patient has consented to  Procedure(s): TRANSESOPHAGEAL ECHOCARDIOGRAM (N/A) as a surgical intervention.  The patient's history has been reviewed, patient examined, no change in status, stable for surgery.  I have reviewed the patient's chart and labs.  Questions were answered to the patient's satisfaction.     [name]
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
## 2023-09-24 NOTE — Progress Notes (Addendum)
Patient's RN reported that patient is very anxious and unable to stay still due to anxiety.  I have ordered Ativan 0.5 mg every 6 hour as needed for management of anxiety.   Tereasa Coop, MD [facility] 09/24/2023, 9:02 PM
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
## 2023-09-24 NOTE — Progress Notes (Signed)
   [facility] has been requested to perform a transesophageal echocardiogram on Kristen Rowland for stroke.    The patient does NOT have any absolute contraindications to a Transesophageal Echocardiogram (TEE). She does report a history of intermittent dysphagia but no recent issues.  The patient has: No other conditions that may impact this procedure.  After careful review of history and examination, the risks and benefits of transesophageal echocardiogram have been explained including risks of esophageal damage, perforation (1:10,000 risk), bleeding, pharyngeal hematoma as well as other potential complications associated with conscious sedation including aspiration, arrhythmia, respiratory failure and death. Alternatives to treatment were discussed, questions were answered. Patient is willing to proceed.   Signed, Corrin Parker, PA-C  09/24/2023 8:10 AM

## 2023-09-24 NOTE — Progress Notes (Signed)
  Echocardiogram Echocardiogram Transesophageal has been performed.  Delcie Roch 09/24/2023, 10:45 AM

## 2023-09-24 NOTE — Progress Notes (Signed)
PROGRESS NOTE    Kristen Rowland  BJY:[id number] DOB: [birth year] DOA: 09/22/2023 PCP: Patient, No Pcp Per    Chief Complaint  Patient presents with   Altered Mental Status    Brief Narrative:  Kristen Rowland is a 53 y.o. female who takes no medications who presented to the ED on 09/22/2023 with vision loss as well as alcohol abuse with suicidal intent. She reported right eye vision loss by which she means the right side of her vision became abnormal then lost. Work up included MRI of the brain which showed acute confluent left PCA territory infarcts with petechial hemorrhage and acute bilateral cerebellar infarct. Acute infarct in the right basal ganglia and right parietal lobe. 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 Aspirin was given after discussion with neurology, and the patient was admitted this morning from [facility] with plans to go to [facility] for stroke neurology evaluation and work up.      Assessment & Plan:   Principal Problem:   Acute CVA (cerebrovascular accident) (HCC) Active Problems:   Tobacco use disorder   Moderate alcohol use disorder (HCC)   Severe recurrent major depression without psychotic features (HCC)   Suicidal ideation   #1 acute CVA -Likely embolic etiology given distribution of stroke superimposed on small vessel disease. -Patient noted to have presented with right visual loss. -CT head done with left PCA territory infarct as well as bilateral cerebellar infarcts, no acute intracranial hemorrhage. 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 -MRI brain done with acute complaint left PCA territory infarcts with petechial hemorrhage, acute bilateral cerebellar infarcts, punctuate acute infarct in the right basal ganglia and right parietal  lobe, no midline shift. -2D echo ordered with a EF of 60 to 65%,NWMA, no intracardiac source of embolism detected. -LDL 152. -Hemoglobin A1c 6.5. -Patient seen in consultation by neurology who are recommending repeat head CT in 24 hours to confirm stability and petechial hemorrhage. -Neurology recommending TEE which has been ordered and pending for this morning and consideration for loop recorder. -Patient currently on aspirin and Plavix for now pending further workup. -Continue high intensity statin with atorvastatin 80 mg daily. -If patient does not undergo loop recorder placement during this hospitalization will likely need a 30-day event monitor. -Per neurology.  2.  Depression -Patient noted to have a situational component with no current suicidal ideation however did express suicidal ideation per admitting physician. -Psychiatric consultation pending for further evaluation.  3.  Tobacco abuse -Tobacco cessation.  4.  Alcohol abuse -Patient states does not drink on a daily basis and drinks occasionally.  Patient with no evidence of withdrawal ongoing at this time. -Alcohol level negative. -UDS negative. -Monitor for signs of withdrawal.   DVT prophylaxis: Lovenox Code Status: Full Family Communication: Updated patient.  No family at bedside. Disposition: TBD  Status is: Inpatient Remains inpatient appropriate because: Severity of illness   Consultants:  Neurology: Dr. Wilford Corner 09/23/2027 Cardiology/EP  Procedures:  CT head 09/23/2023 CT angiogram head and neck 09/23/2023 MRI brain 09/22/2023 2D echo 09/23/2023 TEE pending  Antimicrobials:  Anti-infectives (From admission, onward)    None         Subjective: Patient laying on bed in holding area of procedural unit.  Patient denies any chest pain or shortness of breath.  No abdominal pain.  States  no improvement with right visual loss.  Denies any suicidal ideation.  Feels most of her symptoms are from a brain  issue/possible stroke.  Objective: Vitals:   09/24/23 0000 09/24/23 0400 09/24/23 0743 09/24/23 0902  BP: (!) 133/91 125/85 133/89 (!) 139/90  Pulse: 70 74 (!) 59 64  Resp: 16 17 17 14   Temp: 98.4 F (36.9 C) 98.6 F (37 C) 98.4 F (36.9 C) 98.3 F (36.8 C)  TempSrc: Oral Oral Oral Temporal  SpO2: 97% 95% 98% 95%    Intake/Output Summary (Last 24 hours) at 09/24/2023 1020 Last data filed at 09/23/2023 1910 Gross per 24 hour  Intake 0 ml  Output --  Net 0 ml   There were no vitals filed for this visit.  Examination:  General exam: Appears calm and comfortable  Respiratory system: Clear to auscultation. Respiratory effort normal. Cardiovascular system: S1 & S2 heard, RRR. No JVD, murmurs, rubs, gallops or clicks. No pedal edema. Gastrointestinal system: Abdomen is nondistended, soft and nontender. No organomegaly or masses felt. Normal bowel sounds heard. Central nervous system: Alert and oriented.  Right visual field cut with normal pupillary responses.  Moving extremities spontaneously.  Extremities: Symmetric 5 x 5 power. Skin: No rashes, lesions or ulcers Psychiatry: Judgement and insight appear normal. Mood & affect appropriate.     Data Reviewed: I have personally reviewed following labs and imaging studies  CBC: Recent Labs  Lab 09/22/23 2224 09/22/23 2238 09/23/23 0403  WBC 12.1*  --  11.5*  NEUTROABS 8.5*  --   --   HGB 14.6 15.0 14.5  HCT 44.0 44.0 42.8  MCV 96.7  --  95.3  PLT 281  --  270    Basic Metabolic Panel: Recent Labs  Lab 09/22/23 2224 09/22/23 2238 09/23/23 0403  NA 137 138  --   K 3.5 3.8  --   CL 101 103  --   CO2 24  --   --   GLUCOSE 133* 131*  --   BUN 19 19  --   CREATININE 0.77 0.80 0.74  CALCIUM 8.9  --   --   MG 2.2  --   --     GFR: CrCl cannot be calculated (Unknown ideal weight.).  Liver Function Tests: Recent Labs  Lab 09/22/23 2224  AST 46*  ALT 36  ALKPHOS 79  BILITOT 0.8  PROT 6.9  ALBUMIN 3.8     CBG: No results for input(s): "GLUCAP" in the last 168 hours.   No results found for this or any previous visit (from the past 240 hours).       Radiology Studies: ECHOCARDIOGRAM COMPLETE Result Date: 09/23/2023    ECHOCARDIOGRAM REPORT   Patient Name:   [facility] Date of Exam: 09/23/2023 Medical Rec #:  [id number]      Height:       69.0 in Accession #:    [id number]     Weight:       180.0 lb Date of Birth:  Dec 17, 1970     BSA:          1.976 m Patient Age:    52 years       BP:           127/113 mmHg Patient Gender: F              HR:           77 bpm. Exam Location:  Inpatient Procedure: 2D Echo, Cardiac Doppler and Color  Doppler Indications:    Stroke  History:        Patient has no prior history of Echocardiogram examinations.                 Stroke, Signs/Symptoms:Chest Pain; Risk Factors:Current Smoker.  Sonographer:    JLS Referring Phys: [id number] ARSHAD N KAKRAKANDY IMPRESSIONS  1. Left ventricular ejection fraction, by estimation, is 60 to 65%. The left ventricle has normal function. The left ventricle has no regional wall motion abnormalities. Left ventricular diastolic parameters were normal.  2. Right ventricular systolic function is normal. The right ventricular size is normal. Tricuspid regurgitation signal is inadequate for assessing PA pressure.  3. The mitral valve is normal in structure. No evidence of mitral valve regurgitation. No evidence of mitral stenosis.  4. The aortic valve is normal in structure. Aortic valve regurgitation is not visualized. No aortic stenosis is present.  5. The inferior vena cava is normal in size with greater than 50% respiratory variability, suggesting right atrial pressure of 3 mmHg. Conclusion(s)/Recommendation(s): No intracardiac source of embolism detected on this transthoracic study. Consider a transesophageal echocardiogram to exclude cardiac source of embolism if clinically indicated. FINDINGS  Left Ventricle: Left ventricular ejection  fraction, by estimation, is 60 to 65%. The left ventricle has normal function. The left ventricle has no regional wall motion abnormalities. The left ventricular internal cavity size was normal in size. There is  no left ventricular hypertrophy. Left ventricular diastolic parameters were normal. Normal left ventricular filling pressure. Right Ventricle: The right ventricular size is normal. No increase in right ventricular wall thickness. Right ventricular systolic function is normal. Tricuspid regurgitation signal is inadequate for assessing PA pressure. Left Atrium: Left atrial size was normal in size. Right Atrium: Right atrial size was normal in size. Pericardium: There is no evidence of pericardial effusion. Mitral Valve: The mitral valve is normal in structure. No evidence of mitral valve regurgitation. No evidence of mitral valve stenosis. Tricuspid Valve: The tricuspid valve is normal in structure. Tricuspid valve regurgitation is not demonstrated. No evidence of tricuspid stenosis. Aortic Valve: The aortic valve is normal in structure. Aortic valve regurgitation is not visualized. No aortic stenosis is present. Aortic valve mean gradient measures 3.0 mmHg. Aortic valve peak gradient measures 5.6 mmHg. Aortic valve area, by VTI measures 2.62 cm. Pulmonic Valve: The pulmonic valve was normal in structure. Pulmonic valve regurgitation is not visualized. No evidence of pulmonic stenosis. Aorta: The aortic root is normal in size and structure. Venous: The inferior vena cava is normal in size with greater than 50% respiratory variability, suggesting right atrial pressure of 3 mmHg. IAS/Shunts: No atrial level shunt detected by color flow Doppler.  LEFT VENTRICLE PLAX 2D LVIDd:         4.10 cm     Diastology LVIDs:         2.70 cm     LV e' medial:    9.46 cm/s LV PW:         0.70 cm     LV E/e' medial:  10.5 LV IVS:        0.70 cm     LV e' lateral:   12.50 cm/s LVOT diam:     2.10 cm     LV E/e' lateral: 8.0 LV  SV:         58 LV SV Index:   29 LVOT Area:     3.46 cm  LV Volumes (MOD) LV vol d, MOD A2C: 68.2 ml  LV vol d, MOD A4C: 96.8 ml LV vol s, MOD A2C: 20.2 ml LV vol s, MOD A4C: 30.8 ml LV SV MOD A2C:     48.0 ml LV SV MOD A4C:     96.8 ml LV SV MOD BP:      68.0 ml RIGHT VENTRICLE             IVC RV Basal diam:  2.50 cm     IVC diam: 1.70 cm RV S prime:     10.70 cm/s TAPSE (M-mode): 2.2 cm LEFT ATRIUM             Index        RIGHT ATRIUM          Index LA diam:        3.10 cm 1.57 cm/m   RA Area:     6.24 cm LA Vol (A2C):   22.3 ml 11.29 ml/m  RA Volume:   10.60 ml 5.37 ml/m LA Vol (A4C):   19.7 ml 9.97 ml/m LA Biplane Vol: 21.2 ml 10.73 ml/m  AORTIC VALVE                    PULMONIC VALVE AV Area (Vmax):    3.05 cm     PV Vmax:       0.94 m/s AV Area (Vmean):   2.49 cm     PV Peak grad:  3.6 mmHg AV Area (VTI):     2.62 cm AV Vmax:           118.00 cm/s AV Vmean:          85.900 cm/s AV VTI:            0.222 m AV Peak Grad:      5.6 mmHg AV Mean Grad:      3.0 mmHg LVOT Vmax:         104.00 cm/s LVOT Vmean:        61.800 cm/s LVOT VTI:          0.168 m LVOT/AV VTI ratio: 0.76  AORTA Ao Root diam: 3.10 cm Ao Asc diam:  3.00 cm MITRAL VALVE MV Area (PHT): 5.27 cm    SHUNTS MV Decel Time: 144 msec    Systemic VTI:  0.17 m MV E velocity: 99.80 cm/s  Systemic Diam: 2.10 cm MV A velocity: 78.30 cm/s MV E/A ratio:  1.27 [name] MD Electronically signed by [name] MD Signature Date/Time: 09/23/2023/4:00:27 PM    Final    CT HEAD WO CONTRAST ( ) Result Date: 09/23/2023 CLINICAL DATA:  Stroke follow-up. Petechial bleed noted on recent MRI. EXAM: CT HEAD WITHOUT CONTRAST TECHNIQUE: Contiguous axial images were obtained from the base of the skull through the vertex without intravenous contrast. RADIATION DOSE REDUCTION: This exam was performed according to the departmental dose-optimization program which includes automated exposure control, adjustment of the mA and/or kV according to patient size and/or  use of iterative reconstruction technique. COMPARISON:  CT angiography dated 09/23/2023 and MRI dated 09/22/2023. FINDINGS: Brain: Left PCA territory infarct noted. Additional areas of infarct involving the cerebellar hemispheres. There is no acute intracranial hemorrhage. No midline shift. No extra-axial fluid collection. Vascular: No hyperdense vessel or unexpected calcification. Skull: Normal. Negative for fracture or focal lesion. Sinuses/Orbits: No acute finding. 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 No acute intracranial hemorrhage. Electronically Signed   By: Elgie Collard M.D.   On: 09/23/2023  13:13   CT ANGIO HEAD NECK W WO CM Result Date: 09/23/2023 CLINICAL DATA:  Neuro deficit, acute, stroke suspected a centric EXAM: CT ANGIOGRAPHY HEAD AND NECK WITH AND WITHOUT CONTRAST TECHNIQUE: Multidetector CT imaging of the head and neck was performed using the standard protocol during bolus administration of intravenous contrast. Multiplanar CT image reconstructions and MIPs were obtained to evaluate the vascular anatomy. Carotid stenosis measurements (when applicable) are obtained utilizing NASCET criteria, using the distal internal carotid diameter as the denominator. RADIATION DOSE REDUCTION: This exam was performed according to the departmental dose-optimization program which includes automated exposure control, adjustment of the mA and/or kV according to patient size and/or use of iterative reconstruction technique. CONTRAST:  OMNIPAQUE IOHEXOL 350 MG/ML SOLN COMPARISON:  Same day MRI. FINDINGS: CT HEAD FINDINGS Brain: Known acute infarcts better characterized on same day MRI head. No evidence of progressive mass effect or mass occupying acute hemorrhage. Petechial hemorrhage in the left occipital lobe. No hydrocephalus. No visible mass lesion. Vascular: See below. Skull: No acute fracture. Sinuses/Orbits: Clear sinuses.  No acute orbital  findings. Other: No mastoid effusions. Review of the MIP images confirms the above findings CTA NECK FINDINGS Aortic arch: Atherosclerosis.  Great vessel origins are patent. Right carotid system: No evidence of dissection, stenosis (50% or greater), or occlusion. Left carotid system: No evidence of dissection, stenosis (50% or greater), or occlusion. 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 Eccentric intraluminal filling defect at this location (for example see series 13, images 12 through 15). Remainder of the left vertebral artery is patent/are opacified. Right vertebral artery is patent without significant stenosis. Skeleton: No acute abnormality on limited assessment. Other neck: No acute abnormality on limited assessment. Upper chest: Visualized lung apices are clear. Review of the MIP images confirms the above findings CTA HEAD FINDINGS Anterior circulation: Bilateral intracranial ICAs, MCAs, and ACAs are patent without proximal high-grade stenosis. No aneurysm identified. Posterior circulation: Bilateral intradural vertebral arteries, basilar artery, and bilateral post cerebral arteries are patent without proximal hemodynamically significant stenosis. No aneurysm identified. Venous sinuses: As permitted by contrast timing, patent. Review of the MIP images confirms the above findings IMPRESSION: 1. 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 Eccentric intraluminal filling defect at this location is suspicious for intramural thrombus. 2. No emergent large vessel occlusion. Electronically Signed   By: Feliberto Harts M.D.   On: 09/23/2023 01:45   MR BRAIN WO CONTRAST Result Date: 09/23/2023 CLINICAL DATA:  Neuro deficit, acute, stroke suspected EXAM: MRI HEAD WITHOUT CONTRAST TECHNIQUE: Multiplanar, multiecho pulse sequences of the brain and surrounding structures were obtained without intravenous contrast. COMPARISON:  CT head 05/08/2023. FINDINGS: Brain: Confluent  acute left PCA territory infarct which involves the left occipital lobe as well as the left thalamus and left hippocampus. Associated petechial hemorrhage. Acute infarcts in bilateral cerebellum. Additional punctate acute infarct in the right basal ganglia and right parietal lobe. Areas of associated edema. No midline shift. Vascular: Please see forthcoming CTA head/neck. Skull and upper cervical spine: Normal marrow signal. Sinuses/Orbits: Clear sinuses.  No acute orbital findings. IMPRESSION: 1. Acute confluent left PCA territory infarcts with petechial hemorrhage. 2. Acute bilateral cerebellar infarcts. 3. Punctate acute infarct in the right basal ganglia and right parietal lobe. 4. No midline shift. Electronically Signed   By: Feliberto Harts M.D.   On: 09/23/2023 01:01        Scheduled Meds:  [MAR Hold]  stroke: early stages of recovery book  Does not apply Once   [MAR Hold] aspirin EC  81 mg Oral Daily   [MAR Hold] atorvastatin  80 mg Oral Daily   [MAR Hold] clopidogrel  75 mg Oral Daily   [MAR Hold] enoxaparin (LOVENOX) injection  40 mg Subcutaneous Q24H   [MAR Hold] thiamine (VITAMIN B1) injection  100 mg Intravenous Q24H   Continuous Infusions:  sodium chloride       LOS: 1 day    Time spent: 40 minutes    Ramiro Harvest, MD [facility]   To contact the attending provider between 7A-7P or the covering provider during after hours 7P-7A, please log into the web site [email or web address] and access using universal [place] password for that web site. If you do not have the password, please call the hospital operator.  09/24/2023, 10:20 AM

## 2023-09-24 NOTE — Plan of Care (Signed)
 Will continue to monitor pt progress.

## 2023-09-24 NOTE — Progress Notes (Addendum)
STROKE TEAM PROGRESS NOTE   SUBJECTIVE (INTERVAL HISTORY) No family is at the bedside.  Overall her condition is gradually improving.  She still has left hemianopia but able to see hand reading on the left upper quadrant.  She admitted current smoker and quit alcohol 1 week ago.  MRI showed cardioembolic pattern.  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    OBJECTIVE Temp:  [98 F (36.7 C)-98.6 F (37 C)] 98.2 F (36.8 C) (01/31 1557) Pulse Rate:  [59-74] 73 (01/31 1557) Cardiac Rhythm: Normal sinus rhythm (01/31 1035) Resp:  [12-19] 17 (01/31 1557) BP: (90-139)/(70-98) 116/77 (01/31 1557) SpO2:  [93 %-98 %] 96 % (01/31 1557)  No results for input(s): "GLUCAP" in the last 168 hours. Recent Labs  Lab 09/22/23 2224 09/22/23 2238 09/23/23 0403 09/24/23 1125  NA 137 138  --  137  K 3.5 3.8  --  3.6  CL 101 103  --  102  CO2 24  --   --  23  GLUCOSE 133* 131*  --  133*  BUN 19 19  --  17  CREATININE 0.77 0.80 0.74 0.82  CALCIUM 8.9  --   --  8.7*  MG 2.2  --   --  2.1   Recent Labs  Lab 09/22/23 2224  AST 46*  ALT 36  ALKPHOS 79  BILITOT 0.8  PROT 6.9  ALBUMIN 3.8   Recent Labs  Lab 09/22/23 2224 09/22/23 2238 09/23/23 0403 09/24/23 0930  WBC 12.1*  --  11.5* 9.5  NEUTROABS 8.5*  --   --   --   HGB 14.6 15.0 14.5 14.7  HCT 44.0 44.0 42.8 42.9  MCV 96.7  --  95.3 94.1  PLT 281  --  270 310   No results for input(s): "CKTOTAL", "CKMB", "CKMBINDEX", "TROPONINI" in the last 168 hours. Recent Labs    09/22/23 2224  LABPROT 13.3  INR 1.0   Recent Labs    09/23/23 0215  COLORURINE STRAW*  LABSPEC 1.023  PHURINE 6.0  GLUCOSEU NEGATIVE  HGBUR MODERATE*  BILIRUBINUR NEGATIVE  KETONESUR NEGATIVE  PROTEINUR NEGATIVE  NITRITE NEGATIVE  LEUKOCYTESUR NEGATIVE       Component Value Date/Time   CHOL 228  (H) 09/23/2023 0403   TRIG 206 (H) 09/23/2023 0403   HDL 35 (L) 09/23/2023 0403   CHOLHDL 6.5 09/23/2023 0403   VLDL 41 (H) 09/23/2023 0403   LDLCALC 152 (H) 09/23/2023 0403   Lab Results  Component Value Date   HGBA1C 6.5 (H) 09/23/2023      Component Value Date/Time   LABOPIA NONE DETECTED 09/23/2023 0215   COCAINSCRNUR NONE DETECTED 09/23/2023 0215   LABBENZ NONE DETECTED 09/23/2023 0215   AMPHETMU NONE DETECTED 09/23/2023 0215   THCU NONE DETECTED 09/23/2023 0215   LABBARB NONE DETECTED 09/23/2023 0215    Recent Labs  Lab 09/22/23 2224  ETH <10    I have personally reviewed the radiological images below and agree with the radiology interpretations.  ECHO TEE Result Date: 09/24/2023    TRANSESOPHOGEAL ECHO REPORT   Patient Name:   Kristen Rowland [facility] Date of Exam: 09/24/2023 Medical Rec #:  [id number]      Height:       69.0 in Accession #:    [id number]     Weight:  180.0 lb Date of Birth:  Jan 11, 1971     BSA:          1.976 m Patient Age:    52 years       BP:           144/104 mmHg Patient Gender: F              HR:           69 bpm. Exam Location:  Inpatient Procedure: Color Doppler, Cardiac Doppler, Transesophageal Echo and Saline            Contrast Bubble Study Indications:     stroke  History:         Patient has prior history of Echocardiogram examinations, most                  recent 09/23/2023. Risk Factors:Current Smoker.  Sonographer:     Delcie Roch RDCS Referring Phys:  [phone] TRACI R TURNER Diagnosing Phys: [name] MD PROCEDURE: After discussion of the risks and benefits of a TEE, an informed consent was obtained from the patient. TEE procedure time was 10 minutes. The transesophogeal probe was passed without difficulty through the esophogus of the patient. Imaged were obtained with the patient in a left lateral decubitus position. Sedation performed by different physician. The patient was monitored while under deep sedation. Anesthestetic sedation was provided  intravenously by Anesthesiology: 186mg  of Propofol, 60mg  of Lidocaine. The patient's vital signs; including heart rate, blood pressure, and oxygen saturation; remained stable throughout the procedure. The patient developed no complications during the procedure.  IMPRESSIONS  1. Left ventricular ejection fraction, by estimation, is 60 to 65%. The left ventricle has normal function. The left ventricle has no regional wall motion abnormalities.  2. Right ventricular systolic function is normal. The right ventricular size is normal.  3. No left atrial/left atrial appendage thrombus was detected.  4. The mitral valve is normal in structure. Trivial mitral valve regurgitation. No evidence of mitral stenosis.  5. 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 If endocarditis is a concern recommend checking inflammatory markers and blood cultures.     . The aortic valve is tricuspid. Aortic valve regurgitation is not visualized. No aortic stenosis is present.  6. The inferior vena cava is normal in size with greater than 50% respiratory variability, suggesting right atrial pressure of 3 mmHg.  7. Agitated saline contrast bubble study was negative, with no evidence of any interatrial shunt. Conclusion(s)/Recommendation(s): Normal biventricular function without evidence of hemodynamically significant valvular heart disease. FINDINGS  Left Ventricle: Left ventricular ejection fraction, by estimation, is 60 to 65%. The left ventricle has normal function. The left ventricle has no regional wall motion abnormalities. The left ventricular internal cavity size was normal in size. There is  no left ventricular hypertrophy. Right Ventricle: The right ventricular size is normal. No increase in right ventricular wall thickness. Right ventricular systolic function is normal. Left Atrium: Left atrial size was  normal in size. No left atrial/left atrial appendage thrombus was detected. Right Atrium: Right atrial size was normal in size. Pericardium: There is no evidence of pericardial effusion. Mitral Valve: The mitral valve is normal in structure. Trivial mitral valve regurgitation. No evidence of mitral valve stenosis. Tricuspid Valve: The tricuspid valve is normal in structure. Tricuspid valve regurgitation is trivial. No evidence of tricuspid  stenosis. 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 If endocarditis is a concern recommend checking inflammatory markers and blood cultures. The aortic valve is tricuspid. Aortic valve regurgitation is not visualized. No aortic stenosis is present. Pulmonic Valve: The pulmonic valve was normal in structure. Pulmonic valve regurgitation is not visualized. No evidence of pulmonic stenosis. Aorta: The aortic root is normal in size and structure. Venous: The inferior vena cava is normal in size with greater than 50% respiratory variability, suggesting right atrial pressure of 3 mmHg. IAS/Shunts: No atrial level shunt detected by color flow Doppler. Agitated saline contrast was given intravenously to evaluate for intracardiac shunting. Agitated saline contrast bubble study was negative, with no evidence of any interatrial shunt. [name] MD Electronically signed by [name] MD Signature Date/Time: 09/24/2023/5:13:15 PM    Final    VAS [place] LOWER EXTREMITY VENOUS (DVT) Result Date: 09/24/2023  Lower Venous DVT Study Patient Name:  Kristen Rowland [facility]  Date of Exam:   09/24/2023 Medical Rec #: [id number]       Accession #:    [id number] Date of Birth: [birth year]      Patient Gender: F Patient Age:   44 years Exam Location:  [facility] Procedure:      VAS [place] LOWER EXTREMITY VENOUS (DVT) Referring Phys: Scheryl Marten Felix Meras  --------------------------------------------------------------------------------  Indications: Stroke.  Comparison Study: No prior exam. Performing Technologist: Fernande Bras  Examination Guidelines: A complete evaluation includes B-mode imaging, spectral Doppler, color Doppler, and power Doppler as needed of all accessible portions of each vessel. Bilateral testing is considered an integral part of a complete examination. Limited examinations for reoccurring indications may be performed as noted. The reflux portion of the exam is performed with the patient in reverse Trendelenburg.  +---------+---------------+---------+-----------+----------+--------------+ RIGHT    CompressibilityPhasicitySpontaneityPropertiesThrombus Aging +---------+---------------+---------+-----------+----------+--------------+ CFV      Full           Yes      Yes                                 +---------+---------------+---------+-----------+----------+--------------+ SFJ      Full           Yes      Yes                                 +---------+---------------+---------+-----------+----------+--------------+ FV Prox  Full                                                        +---------+---------------+---------+-----------+----------+--------------+ FV Mid   Full                                                        +---------+---------------+---------+-----------+----------+--------------+ FV DistalFull                                                        +---------+---------------+---------+-----------+----------+--------------+  PFV      Full                                                        +---------+---------------+---------+-----------+----------+--------------+ POP      Full           Yes      Yes                                 +---------+---------------+---------+-----------+----------+--------------+ PTV      Full                                                         +---------+---------------+---------+-----------+----------+--------------+ PERO     Full                                                        +---------+---------------+---------+-----------+----------+--------------+   +---------+---------------+---------+-----------+----------+--------------+ LEFT     CompressibilityPhasicitySpontaneityPropertiesThrombus Aging +---------+---------------+---------+-----------+----------+--------------+ CFV      Full           Yes      Yes                                 +---------+---------------+---------+-----------+----------+--------------+ SFJ      Full           Yes      Yes                                 +---------+---------------+---------+-----------+----------+--------------+ FV Prox  Full                                                        +---------+---------------+---------+-----------+----------+--------------+ FV Mid   Full                                                        +---------+---------------+---------+-----------+----------+--------------+ FV DistalFull                                                        +---------+---------------+---------+-----------+----------+--------------+ PFV      Full                                                        +---------+---------------+---------+-----------+----------+--------------+  POP      Full           Yes      Yes                                 +---------+---------------+---------+-----------+----------+--------------+ PTV      Full                                                        +---------+---------------+---------+-----------+----------+--------------+ PERO     Full                                                        +---------+---------------+---------+-----------+----------+--------------+    Summary: BILATERAL: - No evidence of deep vein thrombosis seen in the lower extremities, bilaterally.  -No evidence of popliteal cyst, bilaterally.   *See table(s) above for measurements and observations.    Preliminary    EP STUDY Result Date: 09/24/2023 See surgical note for result.  ECHOCARDIOGRAM COMPLETE Result Date: 09/23/2023    ECHOCARDIOGRAM REPORT   Patient Name:   Kristen Rowland [facility] Date of Exam: 09/23/2023 Medical Rec #:  [id number]      Height:       69.0 in Accession #:    [id number]     Weight:       180.0 lb Date of Birth:  05/24/1971     BSA:          1.976 m Patient Age:    52 years       BP:           127/113 mmHg Patient Gender: F              HR:           77 bpm. Exam Location:  Inpatient Procedure: 2D Echo, Cardiac Doppler and Color Doppler Indications:    Stroke  History:        Patient has no prior history of Echocardiogram examinations.                 Stroke, Signs/Symptoms:Chest Pain; Risk Factors:Current Smoker.  Sonographer:    JLS Referring Phys: [id number] ARSHAD N KAKRAKANDY IMPRESSIONS  1. Left ventricular ejection fraction, by estimation, is 60 to 65%. The left ventricle has normal function. The left ventricle has no regional wall motion abnormalities. Left ventricular diastolic parameters were normal.  2. Right ventricular systolic function is normal. The right ventricular size is normal. Tricuspid regurgitation signal is inadequate for assessing PA pressure.  3. The mitral valve is normal in structure. No evidence of mitral valve regurgitation. No evidence of mitral stenosis.  4. The aortic valve is normal in structure. Aortic valve regurgitation is not visualized. No aortic stenosis is present.  5. The inferior vena cava is normal in size with greater than 50% respiratory variability, suggesting right atrial pressure of 3 mmHg. Conclusion(s)/Recommendation(s): No intracardiac source of embolism detected on this transthoracic study. Consider a transesophageal echocardiogram to exclude cardiac source of embolism if clinically indicated. FINDINGS  Left Ventricle: Left ventricular  ejection fraction, by estimation, is 60 to 65%. The left  ventricle has normal function. The left ventricle has no regional wall motion abnormalities. The left ventricular internal cavity size was normal in size. There is  no left ventricular hypertrophy. Left ventricular diastolic parameters were normal. Normal left ventricular filling pressure. Right Ventricle: The right ventricular size is normal. No increase in right ventricular wall thickness. Right ventricular systolic function is normal. Tricuspid regurgitation signal is inadequate for assessing PA pressure. Left Atrium: Left atrial size was normal in size. Right Atrium: Right atrial size was normal in size. Pericardium: There is no evidence of pericardial effusion. Mitral Valve: The mitral valve is normal in structure. No evidence of mitral valve regurgitation. No evidence of mitral valve stenosis. Tricuspid Valve: The tricuspid valve is normal in structure. Tricuspid valve regurgitation is not demonstrated. No evidence of tricuspid stenosis. Aortic Valve: The aortic valve is normal in structure. Aortic valve regurgitation is not visualized. No aortic stenosis is present. Aortic valve mean gradient measures 3.0 mmHg. Aortic valve peak gradient measures 5.6 mmHg. Aortic valve area, by VTI measures 2.62 cm. Pulmonic Valve: The pulmonic valve was normal in structure. Pulmonic valve regurgitation is not visualized. No evidence of pulmonic stenosis. Aorta: The aortic root is normal in size and structure. Venous: The inferior vena cava is normal in size with greater than 50% respiratory variability, suggesting right atrial pressure of 3 mmHg. IAS/Shunts: No atrial level shunt detected by color flow Doppler.  LEFT VENTRICLE PLAX 2D LVIDd:         4.10 cm     Diastology LVIDs:         2.70 cm     LV e' medial:    9.46 cm/s LV PW:         0.70 cm     LV E/e' medial:  10.5 LV IVS:        0.70 cm     LV e' lateral:   12.50 cm/s LVOT diam:     2.10 cm     LV E/e'  lateral: 8.0 LV SV:         58 LV SV Index:   29 LVOT Area:     3.46 cm  LV Volumes (MOD) LV vol d, MOD A2C: 68.2 ml LV vol d, MOD A4C: 96.8 ml LV vol s, MOD A2C: 20.2 ml LV vol s, MOD A4C: 30.8 ml LV SV MOD A2C:     48.0 ml LV SV MOD A4C:     96.8 ml LV SV MOD BP:      68.0 ml RIGHT VENTRICLE             IVC RV Basal diam:  2.50 cm     IVC diam: 1.70 cm RV S prime:     10.70 cm/s TAPSE (M-mode): 2.2 cm LEFT ATRIUM             Index        RIGHT ATRIUM          Index LA diam:        3.10 cm 1.57 cm/m   RA Area:     6.24 cm LA Vol (A2C):   22.3 ml 11.29 ml/m  RA Volume:   10.60 ml 5.37 ml/m LA Vol (A4C):   19.7 ml 9.97 ml/m LA Biplane Vol: 21.2 ml 10.73 ml/m  AORTIC VALVE                    PULMONIC VALVE AV Area (Vmax):    3.05 cm  PV Vmax:       0.94 m/s AV Area (Vmean):   2.49 cm     PV Peak grad:  3.6 mmHg AV Area (VTI):     2.62 cm AV Vmax:           118.00 cm/s AV Vmean:          85.900 cm/s AV VTI:            0.222 m AV Peak Grad:      5.6 mmHg AV Mean Grad:      3.0 mmHg LVOT Vmax:         104.00 cm/s LVOT Vmean:        61.800 cm/s LVOT VTI:          0.168 m LVOT/AV VTI ratio: 0.76  AORTA Ao Root diam: 3.10 cm Ao Asc diam:  3.00 cm MITRAL VALVE MV Area (PHT): 5.27 cm    SHUNTS MV Decel Time: 144 msec    Systemic VTI:  0.17 m MV E velocity: 99.80 cm/s  Systemic Diam: 2.10 cm MV A velocity: 78.30 cm/s MV E/A ratio:  1.27 [name] MD Electronically signed by [name] MD Signature Date/Time: 09/23/2023/4:00:27 PM    Final    CT HEAD WO CONTRAST ( ) Result Date: 09/23/2023 CLINICAL DATA:  Stroke follow-up. Petechial bleed noted on recent MRI. EXAM: CT HEAD WITHOUT CONTRAST TECHNIQUE: Contiguous axial images were obtained from the base of the skull through the vertex without intravenous contrast. RADIATION DOSE REDUCTION: This exam was performed according to the departmental dose-optimization program which includes automated exposure control, adjustment of the mA and/or kV according to  patient size and/or use of iterative reconstruction technique. COMPARISON:  CT angiography dated 09/23/2023 and MRI dated 09/22/2023. FINDINGS: Brain: Left PCA territory infarct noted. Additional areas of infarct involving the cerebellar hemispheres. There is no acute intracranial hemorrhage. No midline shift. No extra-axial fluid collection. Vascular: No hyperdense vessel or unexpected calcification. Skull: Normal. Negative for fracture or focal lesion. Sinuses/Orbits: No acute finding. 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 No acute intracranial hemorrhage. Electronically Signed   By: Elgie Collard M.D.   On: 09/23/2023 13:13   CT ANGIO HEAD NECK W WO CM Result Date: 09/23/2023 CLINICAL DATA:  Neuro deficit, acute, stroke suspected a centric EXAM: CT ANGIOGRAPHY HEAD AND NECK WITH AND WITHOUT CONTRAST TECHNIQUE: Multidetector CT imaging of the head and neck was performed using the standard protocol during bolus administration of intravenous contrast. Multiplanar CT image reconstructions and MIPs were obtained to evaluate the vascular anatomy. Carotid stenosis measurements (when applicable) are obtained utilizing NASCET criteria, using the distal internal carotid diameter as the denominator. RADIATION DOSE REDUCTION: This exam was performed according to the departmental dose-optimization program which includes automated exposure control, adjustment of the mA and/or kV according to patient size and/or use of iterative reconstruction technique. CONTRAST:  OMNIPAQUE IOHEXOL 350 MG/ML SOLN COMPARISON:  Same day MRI. FINDINGS: CT HEAD FINDINGS Brain: Known acute infarcts better characterized on same day MRI head. No evidence of progressive mass effect or mass occupying acute hemorrhage. Petechial hemorrhage in the left occipital lobe. No hydrocephalus. No visible mass lesion. Vascular: See below. Skull: No acute fracture. Sinuses/Orbits: Clear sinuses.   No acute orbital findings. Other: No mastoid effusions. Review of the MIP images confirms the above findings CTA NECK FINDINGS Aortic arch: Atherosclerosis.  Great vessel origins are patent. Right carotid system: No evidence of dissection, stenosis (  50% or greater), or occlusion. Left carotid system: No evidence of dissection, stenosis (50% or greater), or occlusion. 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 Eccentric intraluminal filling defect at this location (for example see series 13, images 12 through 15). Remainder of the left vertebral artery is patent/are opacified. Right vertebral artery is patent without significant stenosis. Skeleton: No acute abnormality on limited assessment. Other neck: No acute abnormality on limited assessment. Upper chest: Visualized lung apices are clear. Review of the MIP images confirms the above findings CTA HEAD FINDINGS Anterior circulation: Bilateral intracranial ICAs, MCAs, and ACAs are patent without proximal high-grade stenosis. No aneurysm identified. Posterior circulation: Bilateral intradural vertebral arteries, basilar artery, and bilateral post cerebral arteries are patent without proximal hemodynamically significant stenosis. No aneurysm identified. Venous sinuses: As permitted by contrast timing, patent. Review of the MIP images confirms the above findings IMPRESSION: 1. 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 Eccentric intraluminal filling defect at this location is suspicious for intramural thrombus. 2. No emergent large vessel occlusion. Electronically Signed   By: Feliberto Harts M.D.   On: 09/23/2023 01:45   MR BRAIN WO CONTRAST Result Date: 09/23/2023 CLINICAL DATA:  Neuro deficit, acute, stroke suspected EXAM: MRI HEAD WITHOUT CONTRAST TECHNIQUE: Multiplanar, multiecho pulse sequences of the brain and surrounding structures were obtained without intravenous contrast. COMPARISON:  CT head 05/08/2023. FINDINGS:  Brain: Confluent acute left PCA territory infarct which involves the left occipital lobe as well as the left thalamus and left hippocampus. Associated petechial hemorrhage. Acute infarcts in bilateral cerebellum. Additional punctate acute infarct in the right basal ganglia and right parietal lobe. Areas of associated edema. No midline shift. Vascular: Please see forthcoming CTA head/neck. Skull and upper cervical spine: Normal marrow signal. Sinuses/Orbits: Clear sinuses.  No acute orbital findings. IMPRESSION: 1. Acute confluent left PCA territory infarcts with petechial hemorrhage. 2. Acute bilateral cerebellar infarcts. 3. Punctate acute infarct in the right basal ganglia and right parietal lobe. 4. No midline shift. Electronically Signed   By: Feliberto Harts M.D.   On: 09/23/2023 01:01     PHYSICAL EXAM  Temp:  [98 F (36.7 C)-98.6 F (37 C)] 98.2 F (36.8 C) (01/31 1557) Pulse Rate:  [59-74] 73 (01/31 1557) Resp:  [12-19] 17 (01/31 1557) BP: (90-139)/(70-98) 116/77 (01/31 1557) SpO2:  [93 %-98 %] 96 % (01/31 1557)  General - Well nourished, well developed, in no apparent distress.  Ophthalmologic - fundi not visualized due to noncooperation.  Cardiovascular - Regular rhythm and rate.  Mental Status -  Level of arousal and orientation to time, place, and person were intact. Language including expression, naming, repetition, comprehension was assessed and found intact. Fund of Knowledge was assessed and was intact.  Cranial Nerves II - XII - II - Visual field test showed left hemianopia but able to see hand reading on left upper quadrant III, IV, VI - Extraocular movements intact. V - Facial sensation intact bilaterally. VII - Facial movement intact bilaterally. VIII - Hearing & vestibular intact bilaterally. X - Palate elevates symmetrically. XI - Chin turning & shoulder shrug intact bilaterally. XII - Tongue protrusion intact.  Motor Strength - The patient's strength was  normal in all extremities and pronator drift was absent.  Bulk was normal and fasciculations were absent.   Motor Tone - Muscle tone was assessed at the neck and appendages and was normal.  Reflexes - The patient's reflexes were symmetrical in all extremities and she had no pathological reflexes.  Sensory - Light touch, temperature/pinprick were assessed and were symmetrical.    Coordination - The patient had normal movements in the hands and feet with no ataxia or dysmetria.  Tremor was absent.  Gait and Station - deferred.   ASSESSMENT/PLAN Kristen Rowland is a 53 y.o. female with history of current smoker and alcohol abuse admitted for right hemianopia with headache. No TNK given due to outside window.    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  No PFO. LE venous Doppler no DVT LDL 152 HgbA1c 6.5 UDS negative Lovenox for VTE prophylaxis No antithrombotic prior to admission, now on aspirin 81 mg daily and clopidogrel 75 mg daily.  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  Repeat CTA in 2 months, if left VA thrombus resolves, may switch Eliquis to antiplatelet. 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 Follow-up with cardiology as outpatient   Pre-diabetes HgbA1c 6.5 goal < 7.0 CBG monitoring SSI DM education and close PCP follow up  Hypertension Stable with fluctuation Long term BP goal normotensive  Hyperlipidemia Home meds: None LDL 152, goal < 70 Now on Lipitor 80 Continue statin at discharge  Tobacco abuse Current smoker Smoking cessation counseling provided Pt is willing to quit  Alcohol use Last drink 1 week ago Patient willing to continue abstain from alcohol Onb1/FA/MVI  Other Stroke Risk Factors   Other Active Problems Mild leukocytosis, WBC 11.5  Hospital day # 1  I discussed with Dr. Janee Morn. I spent extensive face-to-face time with the patient, more than 50% of which was spent in counseling and coordination of care, reviewing test results, images and medication, and discussing the diagnosis, treatment plan and potential prognosis. This patient's care requiresreview of multiple databases, neurological assessment, discussion with family, other specialists and medical decision making of high complexity.      Marvel Plan, MD PhD Stroke Neurology 09/24/2023 5:41 PM    To contact Stroke Continuity provider, please refer to [email or web address]. After hours, contact General Neurology

## 2023-09-24 NOTE — Evaluation (Signed)
Occupational Therapy Evaluation Patient Details Name: Kristen Rowland MRN: [id number] DOB: April 30, 1971 Today's Date: 09/24/2023   History of Present Illness Pt is a 53 y.o. female who presented to the ED with c/o R vision loss and AMS. 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 PMH: depression, ETOH abuse, chest pain, hypotension   Clinical Impression   Prior to admission patient was independent with ADLS and Driving.  Currently the patient demonstrates good strength, flexibility and mobility, but has a R homonomous Hemianopsia which is impairing her safety with all mobility  and ADLS.  She will benefit from continued OP OT to address the visual loss and compenstation stratagies.      If plan is discharge home, recommend the following: A little help with bathing/dressing/bathroom;A little help with walking and/or transfers;Assistance with cooking/housework    Functional Status Assessment  Patient has had a recent decline in their functional status and demonstrates the ability to make significant improvements in function in a reasonable and predictable amount of time.  Equipment Recommendations  None recommended by OT    Recommendations for Other Services       Precautions / Restrictions Precautions Precaution Comments: right homonymous hemianopsia Restrictions Weight Bearing Restrictions Per Provider Order: No      Mobility Bed Mobility Overal bed mobility: Independent             General bed mobility comments: `    Transfers                          Balance Overall balance assessment: Needs assistance Sitting-balance support: No upper extremity supported, Feet supported Sitting balance-Leahy Scale: Normal                                     ADL either performed or assessed with clinical judgement    ADL Overall ADL's : Needs assistance/impaired Eating/Feeding: Independent   Grooming: Independent   Upper Body Bathing: Supervision/ safety   Lower Body Bathing: Supervison/ safety   Upper Body Dressing : Supervision/safety   Lower Body Dressing: Supervision/safety   Toilet Transfer: Supervision/safety       Tub/ Shower Transfer: Supervision/safety   Functional mobility during ADLs: Supervision/safety General ADL Comments: Patient demonstrates good strength, flexibility and mobility, However, her movement is slow and guarded due to the loss of vision on her right.  She cannot anticipated changes to her environment increasing her risk of fall.     Vision Baseline Vision/History: 1 Wears glasses Ability to See in Adequate Light: 0 Adequate Patient Visual Report: Peripheral vision impairment Vision Assessment?: Yes Eye Alignment: Within Functional Limits Ocular Range of Motion: Within Functional Limits Alignment/Gaze Preference: Within Defined Limits Visual Fields: Right homonymous hemianopsia Additional Comments: Full ROM noted, looses object in the R upper and lower quadrants     Perception         Praxis         Pertinent Vitals/Pain Pain Assessment Pain Assessment: 0-10 Pain Score: 5  Pain Location: complained of pain all over her head Pain Intervention(s): Monitored during session     Extremity/Trunk Assessment Upper Extremity Assessment Upper Extremity Assessment: Overall WFL for tasks assessed;Right hand dominant (Able to sign name, snap fingers. Stated it took effort with coordination)   Lower Extremity  Assessment Lower Extremity Assessment: Defer to PT evaluation   Cervical / Trunk Assessment Cervical / Trunk Assessment: Normal   Communication Communication Communication: No apparent difficulties   Cognition Arousal: Alert Behavior During Therapy: WFL for tasks assessed/performed Overall Cognitive Status: Within Functional Limits for tasks  assessed                                       General Comments       Exercises     Shoulder Instructions      Home Living Family/patient expects to be discharged to:: Private residence Living Arrangements: Spouse/significant other Available Help at Discharge: Family;Friend(s);Available PRN/intermittently Type of Home: House Home Access: Stairs to enter [employer] of Steps: 2   Home Layout: One level     Bathroom Shower/Tub: IT trainer: Standard Bathroom Accessibility: Yes   Home Equipment: None   Additional Comments: Stated that significant other is there, but not likely to assist with ADLs      Prior Functioning/Environment Prior Level of Function : Independent/Modified Independent;Driving                        OT Problem List: Impaired vision/perception      OT Treatment/Interventions: Self-care/ADL training;Therapeutic activities;Visual/perceptual remediation/compensation    OT Goals(Current goals can be found in the care plan section) Acute Rehab OT Goals Patient Stated Goal: To get her vision back OT Goal Formulation: With patient Time For Goal Achievement: 10/08/23 Potential to Achieve Goals: Good ADL Goals Additional ADL Goal #1: Patient will verbalize 3 compensation strategies for right visual field loss independently Additional ADL Goal #2: patient will negotiate a crowded environment using compensation techniques for visual field loss with minimal verbal cues Additional ADL Goal #3: Patient will demonstrate compensation stratagies throught ADL activity with occassional verbal cues  OT Frequency: Min 1X/week    Co-evaluation              AM-PAC OT "6 Clicks" Daily Activity     Outcome Measure Help from another person eating meals?: None Help from another person taking care of personal grooming?: None Help from another person toileting, which includes using toliet, bedpan, or  urinal?: A Little Help from another person bathing (including washing, rinsing, drying)?: A Little Help from another person to put on and taking off regular upper body clothing?: A Little Help from another person to put on and taking off regular lower body clothing?: A Little 6 Click Score: 20   End of Session Nurse Communication: Other (comment) (plan for OP)  Activity Tolerance: Patient tolerated treatment well Patient left: in bed;with call bell/phone within reach  OT Visit Diagnosis: Low vision, both eyes (H54.2)                Time: 4098-1191 OT Time Calculation (min): 27 min Charges:  OT General Charges $OT Visit: 1 Visit OT Evaluation $OT Eval Low Complexity: 1 Low OT Treatments $Therapeutic Activity: 8-22 mins  Hal Neer OTR/L   Malachi Bonds 09/24/2023, 1:42 PM

## 2023-09-24 NOTE — Anesthesia Postprocedure Evaluation (Signed)
Anesthesia Post Note  Patient: Kristen Rowland  Procedure(s) Performed: TRANSESOPHAGEAL ECHOCARDIOGRAM     Patient location during evaluation: Cath Lab Anesthesia Type: MAC Level of consciousness: awake and alert Pain management: pain level controlled Vital Signs Assessment: post-procedure vital signs reviewed and stable Respiratory status: spontaneous breathing, nonlabored ventilation and respiratory function stable Cardiovascular status: stable and blood pressure returned to baseline Postop Assessment: no apparent nausea or vomiting Anesthetic complications: no  No notable events documented.  Last Vitals:  Vitals:   09/24/23 1045 09/24/23 1139  BP: (!) 130/95 (!) 124/98  Pulse: 69 65  Resp: 12 17  Temp:  36.8 C  SpO2: 94% 98%    Last Pain:  Vitals:   09/24/23 1139  TempSrc: Oral  PainSc:                  Delmas Faucett,W. EDMOND

## 2023-09-24 NOTE — Transfer of Care (Signed)
Immediate Anesthesia Transfer of Care Note  Patient: Kristen Rowland  Procedure(s) Performed: TRANSESOPHAGEAL ECHOCARDIOGRAM  Patient Location: PACU  Anesthesia Type:MAC  Level of Consciousness: awake and alert   Airway & Oxygen Therapy: Patient Spontanous Breathing  Post-op Assessment: Report given to RN  Post vital signs: Reviewed and stable  Last Vitals:  Vitals Value Taken Time  BP 90/69   Temp    Pulse 79   Resp 16   SpO2 97     Last Pain:  Vitals:   09/24/23 0902  TempSrc: Temporal  PainSc:       Patients Stated Pain Goal: 0 (09/24/23 0400)  Complications: No notable events documented.

## 2023-09-24 NOTE — CV Procedure (Addendum)
     PROCEDURE NOTE:  Procedure:  Transesophageal echocardiogram Operator:  [name], MD Indications:  CVA Complications: None  During this procedure the patient is administered a total of Propofol 186 mcg to achieve and maintain moderate conscious sedation as well as 60mg  Lidocaine and 4mg  Zofran.  The patient's heart rate, blood pressure, and oxygen saturation are monitored continuously during the procedure. The period of conscious sedation is 10 minutes, of which I was present face-to-face 100% of this time. Ardean Larsen, CRNA is an independent, trained observer who assisted in the monitoring of the patient's level of consciousness.    Results: Normal LV size and function Normal RV size and function Normal RA Normal LA and LA appendage with no evidence of thrombus.  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  If endocarditis is a concern recommend checking inflammatory markers and blood cultures. Normal interatrial septum with no evidence of shunt by colorflow dopper or agitated saline contrast. Normal thoracic and ascending aorta.  The patient tolerated the procedure well and was transferred back to their room in stable condition.  Signed: [name], MD [facility]
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
## 2023-09-25 ENCOUNTER — Other Ambulatory Visit: Payer: Self-pay | Admitting: Home Health

## 2023-09-25 ENCOUNTER — Other Ambulatory Visit (HOSPITAL_COMMUNITY): Payer: Self-pay

## 2023-09-25 ENCOUNTER — Inpatient Hospital Stay (HOSPITAL_COMMUNITY): Payer: MEDICAID

## 2023-09-25 DIAGNOSIS — F332 Major depressive disorder, recurrent severe without psychotic features: Secondary | ICD-10-CM | POA: Diagnosis not present

## 2023-09-25 DIAGNOSIS — H539 Unspecified visual disturbance: Secondary | ICD-10-CM

## 2023-09-25 DIAGNOSIS — I639 Cerebral infarction, unspecified: Secondary | ICD-10-CM | POA: Diagnosis not present

## 2023-09-25 DIAGNOSIS — F17200 Nicotine dependence, unspecified, uncomplicated: Secondary | ICD-10-CM | POA: Diagnosis not present

## 2023-09-25 DIAGNOSIS — F1020 Alcohol dependence, uncomplicated: Secondary | ICD-10-CM | POA: Diagnosis not present

## 2023-09-25 DIAGNOSIS — G4700 Insomnia, unspecified: Secondary | ICD-10-CM | POA: Insufficient documentation

## 2023-09-25 DIAGNOSIS — H5461 Unqualified visual loss, right eye, normal vision left eye: Secondary | ICD-10-CM | POA: Insufficient documentation

## 2023-09-25 LAB — HOMOCYSTEINE: Homocysteine: 12.2 umol/L (ref 0.0–14.5)

## 2023-09-25 LAB — BASIC METABOLIC PANEL
Anion gap: 11 (ref 5–15)
BUN: 18 mg/dL (ref 6–20)
CO2: 23 mmol/L (ref 22–32)
Calcium: 8.8 mg/dL — ABNORMAL LOW (ref 8.9–10.3)
Chloride: 107 mmol/L (ref 98–111)
Creatinine, Ser: 1.04 mg/dL — ABNORMAL HIGH (ref 0.44–1.00)
GFR, Estimated: >60 mL/min (ref >60)
Glucose, Bld: 111 mg/dL — ABNORMAL HIGH (ref 70–99)
Potassium: 3.9 mmol/L (ref 3.5–5.1)
Sodium: 141 mmol/L (ref 135–145)

## 2023-09-25 LAB — CBC
HCT: 42.2 % (ref 36.0–46.0)
Hemoglobin: 14.4 g/dL (ref 12.0–15.0)
MCH: 32.4 pg (ref 26.0–34.0)
MCHC: 34.1 g/dL (ref 30.0–36.0)
MCV: 94.8 fL (ref 80.0–100.0)
Platelets: 312 10*3/uL (ref 150–400)
RBC: 4.45 MIL/uL (ref 3.87–5.11)
RDW: 13.6 % (ref 11.5–15.5)
WBC: 8.1 10*3/uL (ref 4.0–10.5)
nRBC: 0 % (ref 0.0–0.2)

## 2023-09-25 LAB — LUPUS ANTICOAGULANT PANEL
DRVVT: 45.5 s (ref 0.0–47.0)
PTT Lupus Anticoagulant: 38.1 s (ref 0.0–43.5)

## 2023-09-25 LAB — ANA W/REFLEX IF POSITIVE: Anti Nuclear Antibody (ANA): NEGATIVE

## 2023-09-25 LAB — PROTEIN S, TOTAL: Protein S Ag, Total: 88 % (ref 60–150)

## 2023-09-25 LAB — PROTEIN S ACTIVITY: Protein S Activity: 107 % (ref 63–140)

## 2023-09-25 LAB — PROTEIN C ACTIVITY: Protein C Activity: 134 % (ref 73–180)

## 2023-09-25 MED ORDER — HYDROXYZINE HCL 25 MG PO TABS
25.0000 mg | ORAL_TABLET | Freq: Four times a day (QID) | ORAL | 0 refills | Status: DC | PRN
  Filled 2023-09-25: qty 20, 5d supply, fill #0

## 2023-09-25 MED ORDER — ALBUTEROL SULFATE HFA 108 (90 BASE) MCG/ACT IN AERS
2.0000 | INHALATION_SPRAY | Freq: Four times a day (QID) | RESPIRATORY_TRACT | 1 refills | Status: AC | PRN
  Filled 2023-09-25: qty 18, 25d supply, fill #0

## 2023-09-25 MED ORDER — NICOTINE 21 MG/24HR TD PT24
21.0000 mg | MEDICATED_PATCH | Freq: Every day | TRANSDERMAL | Status: DC
  Administered 2023-09-25: 21 mg via TRANSDERMAL
  Filled 2023-09-25: qty 1

## 2023-09-25 MED ORDER — TRAZODONE HCL 50 MG PO TABS: 50.0000 mg | ORAL_TABLET | Freq: Every evening | ORAL | Status: DC | PRN

## 2023-09-25 MED ORDER — NICOTINE 21 MG/24HR TD PT24
21.0000 mg | MEDICATED_PATCH | Freq: Every day | TRANSDERMAL | 0 refills | Status: DC
  Filled 2023-09-25: qty 28, 28d supply, fill #0

## 2023-09-25 MED ORDER — BUTALBITAL-APAP-CAFFEINE 50-325-40 MG PO TABS
1.0000 | ORAL_TABLET | Freq: Three times a day (TID) | ORAL | 0 refills | Status: DC | PRN
  Filled 2023-09-25: qty 14, 5d supply, fill #0

## 2023-09-25 MED ORDER — BUSPIRONE HCL 10 MG PO TABS: 10.0000 mg | ORAL_TABLET | Freq: Two times a day (BID) | ORAL | Status: DC

## 2023-09-25 MED ORDER — ATORVASTATIN CALCIUM 80 MG PO TABS
80.0000 mg | ORAL_TABLET | Freq: Every day | ORAL | 1 refills | Status: DC
  Filled 2023-09-25: qty 90, 90d supply, fill #0

## 2023-09-25 MED ORDER — MOMETASONE FURO-FORMOTEROL FUM 200-5 MCG/ACT IN AERO
2.0000 | INHALATION_SPRAY | Freq: Two times a day (BID) | RESPIRATORY_TRACT | Status: DC
  Filled 2023-09-25: qty 8.8

## 2023-09-25 MED ORDER — MELATONIN 3 MG PO TABS: 3.0000 mg | ORAL_TABLET | Freq: Every evening | ORAL | Status: DC | PRN

## 2023-09-25 MED ORDER — IPRATROPIUM-ALBUTEROL 0.5-2.5 (3) MG/3ML IN SOLN: 3.0000 mL | Freq: Two times a day (BID) | RESPIRATORY_TRACT | Status: DC

## 2023-09-25 MED ORDER — APIXABAN 5 MG PO TABS
5.0000 mg | ORAL_TABLET | Freq: Two times a day (BID) | ORAL | 1 refills | Status: DC
  Filled 2023-09-25: qty 60, 30d supply, fill #0

## 2023-09-25 MED ORDER — PROSIGHT PO TABS: 1.0000 | ORAL_TABLET | Freq: Every day | ORAL | Status: AC

## 2023-09-25 MED ORDER — FOLIC ACID 1 MG PO TABS: 1.0000 mg | ORAL_TABLET | Freq: Every day | ORAL | Status: AC

## 2023-09-25 MED ORDER — VITAMIN B-1 100 MG PO TABS: 100.0000 mg | ORAL_TABLET | Freq: Every day | ORAL | Status: DC

## 2023-09-25 MED ORDER — IPRATROPIUM-ALBUTEROL 0.5-2.5 (3) MG/3ML IN SOLN: 3.0000 mL | Freq: Four times a day (QID) | RESPIRATORY_TRACT | Status: DC | PRN

## 2023-09-25 NOTE — Progress Notes (Signed)
30 days event monitor arranged per Dr Elberta Fortis request, for stroke, will mail to the patient for the monitor and instruction; patient will see Dr Pennelope Bracken outpatient 10/28/23 as new patient for result review, see AVS for detail

## 2023-09-25 NOTE — Progress Notes (Signed)
Physical Therapy Treatment Patient Details Name: Kristen Rowland MRN: [id number] DOB: 1971/02/07 Today's Date: 09/25/2023   History of Present Illness Pt is a 53 y.o. female who presented to the ED with c/o R vision loss and AMS. 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 PMH: depression, ETOH abuse, chest pain, hypotension    PT Comments  Pt is happy to be going home today, agreeable to walk in hallway to practice walking with horizontal scanning to be able to see on her R side. Practiced counting doors on the right and navigating around obstacle on her right. Pt able to navigate steps and return to room. Discussed possible use of cane in her R hand in public to afford her more space with people around her. Pt is able to mobilize at a supervision level and significant other is able to help her out.      If plan is discharge home, recommend the following: Assist for transportation;Assistance with cooking/housework   Can travel by private vehicle      Yes  Equipment Recommendations  None recommended by PT    Recommendations for Other Services       Precautions / Restrictions Precautions Precautions: Fall;Other (comment) Precaution Comments: right homonymous hemianopsia Restrictions Weight Bearing Restrictions Per Provider Order: No     Mobility  Bed Mobility Overal bed mobility: Independent             General bed mobility comments: `    Transfers Overall transfer level: Modified independent Equipment used: None                    Ambulation/Gait Ambulation/Gait assistance: Supervision Gait Distance (Feet): 300 Feet Assistive device: None Gait Pattern/deviations: Step-through pattern, Decreased stride length Gait velocity: decreased Gait velocity interpretation: 1.31 - 2.62 ft/sec, indicative of limited community  ambulator   General Gait Details: guarded due to visual loss on R, increased time to scan environment, no LOB   Stairs Stairs: Yes Stairs assistance: Supervision Stair Management: Forwards, No rails, Backwards Number of Stairs: 1 General stair comments: x2    Modified Rankin (Stroke Patients Only) Modified Rankin (Stroke Patients Only) Pre-Morbid Rankin Score: No symptoms Modified Rankin: Moderately severe disability     Balance Overall balance assessment: Needs assistance Sitting-balance support: No upper extremity supported, Feet supported Sitting balance-Leahy Scale: Normal     Standing balance support: No upper extremity supported, During functional activity Standing balance-Leahy Scale: Good                              Cognition Arousal: Alert Behavior During Therapy: WFL for tasks assessed/performed Overall Cognitive Status: Impaired/Different from baseline Area of Impairment: Safety/judgement                         Safety/Judgement: Decreased awareness of safety     General Comments: honestly asked if she could get a scooter since she will not be able to drive a car, when asked if that would be a good idea, pt paused and thought for a moment before indicating that would not be safe.           General Comments General comments (skin integrity, edema, etc.): VSS on RA      Pertinent Vitals/Pain Pain Assessment Pain Assessment: Faces Faces Pain Scale:  Hurts a little bit Pain Location: headache Pain Descriptors / Indicators: Headache Pain Intervention(s): Monitored during session, Limited activity within patient's tolerance    Home Living     Available Help at Discharge: Family;Friend(s);Available PRN/intermittently Type of Home: House                      PT Goals (current goals can now be found in the care plan section) Acute Rehab PT Goals Patient Stated Goal: home PT Goal Formulation: With patient Time For Goal  Achievement: 10/08/23 Potential to Achieve Goals: Good Progress towards PT goals: Progressing toward goals    Frequency    Min 1X/week      PT Plan         AM-PAC PT "6 Clicks" Mobility   Outcome Measure  Help needed turning from your back to your side while in a flat bed without using bedrails?: None Help needed moving from lying on your back to sitting on the side of a flat bed without using bedrails?: None Help needed moving to and from a bed to a chair (including a wheelchair)?: None Help needed standing up from a chair using your arms (e.g., wheelchair or bedside chair)?: None Help needed to walk in hospital room?: A Little Help needed climbing 3-5 steps with a railing? : A Little 6 Click Score: 22    End of Session Equipment Utilized During Treatment: Gait belt Activity Tolerance: Patient tolerated treatment well Patient left: in bed;with call bell/phone within reach;with family/visitor present Nurse Communication: Mobility status PT Visit Diagnosis: Difficulty in walking, not elsewhere classified (R26.2)     Time: 8119-1478 PT Time Calculation (min) (ACUTE ONLY): 21 min  Charges:    $Gait Training: 8-22 mins PT General Charges $$ ACUTE PT VISIT: 1 Visit                     Nylan Nevel B. Beverely Risen PT, DPT Acute Rehabilitation Services Please use secure chat or  Call Office [phone]    Elon Alas [facility] 09/25/2023, 3:19 PM

## 2023-09-25 NOTE — Progress Notes (Signed)
Occupational Therapy Treatment Patient Details Name: Kristen Rowland MRN: [id number] DOB: 06/16/1971 Today's Date: 09/25/2023   History of present illness Pt is a 53 y.o. female who presented to the ED with c/o R vision loss and AMS. 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 PMH: depression, ETOH abuse, chest pain, hypotension   OT comments  Pt. Seen for skilled OT treatment session. Pt. Eager and agreeable to participation. Focus on compensatory strategies for management of R homonymous hemianopsia/field cut.  Pt. Able to use anchor method to promote visual scanning while reading and locating items to the R.  Reports it was effective and was able to read an entire paragraph without error.  Utilized these methods also for use of cell, remote control, and navigating items on plate and tray at meal times.  Pt. Reports interest in OPOT. Cont. With acute OT POC.         If plan is discharge home, recommend the following:  A little help with bathing/dressing/bathroom;A little help with walking and/or transfers;Assistance with cooking/housework   Equipment Recommendations  None recommended by OT    Recommendations for Other Services      Precautions / Restrictions Precautions Precautions: Fall;Other (comment) Precaution Comments: right homonymous hemianopsia       Mobility Bed Mobility                    Transfers                         Balance                                           ADL either performed or assessed with clinical judgement   ADL Overall ADL's : Needs assistance/impaired Eating/Feeding: Cueing for compensatory techinques;Cueing for sequencing Eating/Feeding Details (indicate cue type and reason): pt. states "i know im missing items on my plate and tray"  reviewed using the reciept  provided to read each item included at that meal and then locating them to orient herself to where they are.  this will help cont. reading/scanning and also prevent missing items she wants to eat  (use of anchor remains helpful option for reading and scanning food tray)                                        Extremity/Trunk Assessment              Vision Baseline Vision/History: 1 Wears glasses Ability to See in Adequate Light: 0 Adequate Patient Visual Report: Peripheral vision impairment Eye Alignment: Within Functional Limits Ocular Range of Motion: Within Functional Limits Alignment/Gaze Preference: Within Defined Limits Visual Fields: Right homonymous hemianopsia Additional Comments: introduced R side anchor to aide in visual scanning to R along with compensatory strategy of turning head.  reviewed having a person walk on R side during ambulation.  pt. able to verbalize examples of why she is NOT ALLOWED to drive.  pt. able to read full paragraph without error with use of anchor.  also practiced cell phone and remote control use with compensatory strategies of head turns and anchor also.   Perception  Praxis      Cognition Arousal: Alert Behavior During Therapy: WFL for tasks assessed/performed Overall Cognitive Status: Within Functional Limits for tasks assessed                                          Exercises      Shoulder Instructions       General Comments      Pertinent Vitals/ Pain       Pain Assessment Pain Assessment: Faces Faces Pain Scale: Hurts a little bit Pain Location: headache Pain Descriptors / Indicators: Headache Pain Intervention(s): Monitored during session  Home Living                                          Prior Functioning/Environment              Frequency  Min 1X/week        Progress Toward Goals  OT Goals(current goals can now be found in the care plan section)   Progress towards OT goals: Progressing toward goals     Plan      Co-evaluation                 AM-PAC OT "6 Clicks" Daily Activity     Outcome Measure   Help from another person eating meals?: None Help from another person taking care of personal grooming?: None Help from another person toileting, which includes using toliet, bedpan, or urinal?: A Little Help from another person bathing (including washing, rinsing, drying)?: A Little Help from another person to put on and taking off regular upper body clothing?: A Little Help from another person to put on and taking off regular lower body clothing?: A Little 6 Click Score: 20    End of Session    OT Visit Diagnosis: Low vision, both eyes (H54.2)   Activity Tolerance Patient tolerated treatment well   Patient Left in bed;with call bell/phone within reach;with bed alarm set;with family/visitor present   Nurse Communication  MD present towards end of session, pt. Reviewed feeling notable difference with breathing and also her headaches.         Time: 5284-1324 OT Time Calculation (min): 24 min  Charges: OT General Charges $OT Visit: 1 Visit OT Treatments $Self Care/Home Management : 23-37 mins  Boneta Lucks, COTA/L Acute Rehabilitation [phone]   Alessandra Bevels Lorraine-COTA/L 09/25/2023, 10:42 AM

## 2023-09-25 NOTE — Discharge Summary (Signed)
Physician Discharge Summary  Kristen Rowland ZOX:[id number] DOB: 1971/03/27 DOA: 09/22/2023  PCP: Patient, No Pcp Per  Admit date: 09/22/2023 Discharge date: 09/25/2023  Time spent: 60 minutes  Recommendations for Outpatient Follow-up:  Follow-up with primary care MD Squire on 10/19/2023 to establish PCP.  Patient will need a basic metabolic profile done to follow-up on electrolytes and renal function.  Patient's tobacco abuse will need to be followed up upon.  Will need referral to ophthalmology in the outpatient setting. Follow-up with Dr. 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 Follow-up with [facility] in 4 weeks.   Discharge Diagnoses:  Principal Problem:   Acute CVA (cerebrovascular accident) (HCC) Active Problems:   Tobacco use disorder   Moderate alcohol use disorder (HCC)   Severe recurrent major depression without psychotic features (HCC)   Suicidal ideation   Visual changes   Discharge Condition: Stable  Diet recommendation: Heart healthy  There were no vitals filed for this visit.  History of present illness:  HPI per Dr. Ilsa Iha is a 53 y.o. female with history of depression presently not on any medications was brought into the ER the patient had persistent loss of vision in the right eye for the last 3 days.  Patient states over the last one week patient has been drinking a lot of vodka with suicidal intention.  Three days ago she noticed that her right eye was having poor vision and eventually loss of vision.  Since it persisted patient's boyfriend called EMS and was brought to the ER.  Patient states she feels mildly weak on the right side.  Denies taking any overdose of any medications.   ED Course: In the ER patient had MRI of the brain which shows acute confluent left PCA territory infarcts with petechial hemorrhage and acute bilateral cerebellar infarct.  Acute infarct in the  right basal ganglia and right parietal lobe.  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  Neurology on-call was consulted.  Patient is being admitted for further management of acute stroke.  EKG shows normal sinus rhythm.  Hospital Course:  #1 acute CVA -Likely embolic etiology given distribution of stroke superimposed on small vessel disease. -Patient noted to have presented with right visual loss. -CT head done with left PCA territory infarct as well as bilateral cerebellar infarcts, no acute intracranial hemorrhage. 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 -MRI brain done with acute complaint left PCA territory infarcts with petechial hemorrhage, acute bilateral cerebellar infarcts, punctuate acute infarct in the right basal ganglia and right parietal lobe, no midline shift. -2D echo ordered with a EF of 60 to 65%,NWMA, no intracardiac source of embolism detected. -LDL 152. -Hemoglobin A1c 6.5. -Patient seen in consultation by neurology who are recommended repeat head CT in 24 hours to confirm stability and petechial hemorrhage. -Neurology recommended TEE which was done on 09/24/2023 which showed a small mobile density on the AV leaflets seen on the 120 degree view that likely represented Lambl's excrescences but cannot rule out a very small vegetation based on the study.  No PFO noted.  -Patient initially placed on aspirin and Plavix. -Loop recorder recommended however patient declined loop recorder placement during the hospitalization. -Patient subsequently placed on high intensity statin with atorvastatin 80 mg daily. -  Patient was followed by neurology during this hospitalization. -Per neurology giving AV Lambl's excretions and left VA thrombus with cardioembolic stroke  plan patient was subsequently started on anticoagulation with Eliquis with recommendations to repeat CTA in 2 months and if left VA thrombus resolves may switch Eliquis to antiplatelets. -Event monitor will be mailed to patient's home and patient to follow-up with neurology and cardiology in the outpatient setting. -Patient be discharged in stable and improved condition.  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 -Given AV Lambros excretions and left VF thrombus with cardioembolic stroke pattern patient subsequently started on Eliquis per Neurology recommendations. -Outpatient follow-up with cardiology.   3.  Depression -Patient noted to have a situational component with no current suicidal ideation however did express suicidal ideation per admitting physician. -Patient seen in consultation by psychiatry and patient expressed no suicidal ideation and patient cleared by psychiatry for discharge.   -Outpatient follow-up with PCP.    4.  Tobacco abuse -Tobacco cessation stressed to patient. -Patient placed on a nicotine patch. -Outpatient follow-up with PCP.Marland Kitchen   5.  Alcohol abuse -Patient states does not drink on a daily basis and drinks occasionally.  Patient with no evidence of withdrawal ongoing at this time. -Alcohol level negative. -UDS negative. -Patient did not go through withdrawal during the hospitalization.   Procedures: CT head 09/23/2023 CT angiogram head and neck 09/23/2023 MRI brain 09/22/2023 2D echo 09/23/2023 TEE 09/24/2023:  Consultations: Neurology: Dr. Wilford Corner 09/23/2027 Cardiology/EP Psychiatry: Dr. Clovis Riley 09/24/2023  Discharge Exam: Vitals:   09/25/23 0817 09/25/23 1112  BP: (!) 122/54 132/83  Pulse: 62 63  Resp: 16   Temp: 98.4 F (36.9 C) 98.5 F (36.9 C)  SpO2: 97% 97%     General: NAD Cardiovascular: RRR no murmurs rubs or gallops.  No JVD.  No lower extremity edema. Respiratory: Clear to auscultation bilaterally.  No wheezes, no crackles, no rhonchi.  Fair air movement.  Speaking in full sentences.  Discharge Instructions   Discharge Instructions     Ambulatory referral to Neurology   Complete by: As directed    An appointment is requested in approximately: 4 weeks   Ambulatory referral to Occupational Therapy   Complete by: As directed    Ambulatory referral to Ophthalmology   Complete by: As directed    Ambulatory referral to Speech Therapy   Complete by: As directed    Diet - low sodium heart healthy   Complete by: As directed    Increase activity slowly   Complete by: As directed    Increase activity slowly   Complete by: As directed       Allergies as of 09/25/2023   No Known Allergies      Medication List     STOP taking these medications    busPIRone 10 MG tablet Commonly known as: BUSPAR   traZODone 50 MG tablet Commonly known as: DESYREL       TAKE these medications    atorvastatin 80 MG tablet Commonly known as: LIPITOR Take 1 tablet (80 mg total) by mouth daily.   butalbital-acetaminophen-caffeine 50-325-40 MG tablet Commonly known as: FIORICET Take 1 tablet by mouth every 8 (eight) hours as needed for headache.   Eliquis 5 MG Tabs tablet Generic drug: apixaban Take 1 tablet (5 mg total) by mouth 2 (two) times  daily.   folic acid 1 MG tablet Commonly known as: FOLVITE Take 1 tablet (1 mg total) by mouth daily.   hydrOXYzine 25 MG tablet Commonly known as: ATARAX Take 1 tablet (25 mg total) by mouth every 6 (six) hours as needed for anxiety.   melatonin 3 MG Tabs tablet Take 1 tablet (3 mg total) by mouth at bedtime as needed (sleep).   multivitamin Tabs tablet Take 1 tablet by mouth daily.   nicotine 21 mg/24hr patch Commonly known as: NICODERM CQ - dosed in mg/24 hours Place 1 patch (21 mg  total) onto the skin daily.   thiamine 100 MG tablet Commonly known as: Vitamin B-1 Take 1 tablet (100 mg total) by mouth daily.   Ventolin HFA 108 (90 Base) MCG/ACT inhaler Generic drug: albuterol Inhale 2 puffs into the lungs every 6 (six) hours as needed for wheezing or shortness of breath.       No Known Allergies  Follow-up Information     [employer] transportation Follow up.   Why: Call 2-3 days in advance to arrange transportation. Contact information: [phone]        [facility] Follow up on 10/19/2023.   Why: Your appointment is at 9:00 am. please arrive early and bring a picture ID, insurance card and current medications Contact information: [address] [place] [phone]        [facility]. Schedule an appointment as soon as possible for a visit.   Specialty: Rehabilitation Contact information: [address] [place] [address] [phone]        Maisie Fus, MD Follow up on 10/28/2023.   Specialty: Cardiology Why: at 8 am for your new patient appointment with cardiology Contact information: [address] [place] [address] [phone]         [facility]. Schedule an appointment as soon as possible for a visit in 4 week(s).   Specialty: Neurology Contact information: [address] [place] [address] [phone]                 The results of significant diagnostics from this hospitalization (including imaging, microbiology, ancillary and laboratory) are listed below for reference.    Significant Diagnostic Studies: ECHO TEE Result Date: 09/24/2023    TRANSESOPHOGEAL ECHO REPORT   Patient Name:   KHAMILA BASSINGER [facility] Date of Exam: 09/24/2023 Medical Rec #:  [id number]      Height:       69.0 in Accession #:    [id number]     Weight:       180.0 lb  Date of Birth:  11-04-1970     BSA:          1.976 m Patient Age:    52 years       BP:           144/104 mmHg Patient Gender: F              HR:           69 bpm. Exam Location:  Inpatient Procedure: Color Doppler, Cardiac Doppler, Transesophageal Echo and Saline            Contrast Bubble Study Indications:     stroke  History:         Patient has prior history of Echocardiogram examinations, most  recent 09/23/2023. Risk Factors:Current Smoker.  Sonographer:     Delcie Roch RDCS Referring Phys:  [phone] TRACI R TURNER Diagnosing Phys: [name] MD PROCEDURE: After discussion of the risks and benefits of a TEE, an informed consent was obtained from the patient. TEE procedure time was 10 minutes. The transesophogeal probe was passed without difficulty through the esophogus of the patient. Imaged were obtained with the patient in a left lateral decubitus position. Sedation performed by different physician. The patient was monitored while under deep sedation. Anesthestetic sedation was provided intravenously by Anesthesiology: 186mg  of Propofol, 60mg  of Lidocaine. The patient's vital signs; including heart rate, blood pressure, and oxygen saturation; remained stable throughout the procedure. The patient developed no complications during the procedure.  IMPRESSIONS  1. Left ventricular ejection fraction, by estimation, is 60 to 65%. The left ventricle has normal function. The left ventricle has no regional wall motion abnormalities.  2. Right ventricular systolic function is normal. The right ventricular size is normal.  3. No left atrial/left atrial appendage thrombus was detected.  4. The mitral valve is normal in structure. Trivial mitral valve regurgitation. No evidence of mitral stenosis.  5. 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 If endocarditis is a  concern recommend checking inflammatory markers and blood cultures.     . The aortic valve is tricuspid. Aortic valve regurgitation is not visualized. No aortic stenosis is present.  6. The inferior vena cava is normal in size with greater than 50% respiratory variability, suggesting right atrial pressure of 3 mmHg.  7. Agitated saline contrast bubble study was negative, with no evidence of any interatrial shunt. Conclusion(s)/Recommendation(s): Normal biventricular function without evidence of hemodynamically significant valvular heart disease. FINDINGS  Left Ventricle: Left ventricular ejection fraction, by estimation, is 60 to 65%. The left ventricle has normal function. The left ventricle has no regional wall motion abnormalities. The left ventricular internal cavity size was normal in size. There is  no left ventricular hypertrophy. Right Ventricle: The right ventricular size is normal. No increase in right ventricular wall thickness. Right ventricular systolic function is normal. Left Atrium: Left atrial size was normal in size. No left atrial/left atrial appendage thrombus was detected. Right Atrium: Right atrial size was normal in size. Pericardium: There is no evidence of pericardial effusion. Mitral Valve: The mitral valve is normal in structure. Trivial mitral valve regurgitation. No evidence of mitral valve stenosis. Tricuspid Valve: The tricuspid valve is normal in structure. Tricuspid valve regurgitation is trivial. No evidence of tricuspid stenosis. 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 If endocarditis is a concern recommend checking inflammatory markers and blood cultures. The aortic valve is tricuspid. Aortic valve regurgitation is not visualized. No aortic stenosis is present. Pulmonic Valve: The pulmonic valve was normal in structure. Pulmonic  valve regurgitation is not visualized. No evidence of pulmonic stenosis. Aorta: The aortic root is normal in size and structure. Venous: The inferior vena cava is normal in size with greater than 50% respiratory variability, suggesting right atrial pressure of 3 mmHg. IAS/Shunts: No atrial level shunt detected by color flow Doppler. Agitated saline contrast was given intravenously to evaluate for intracardiac shunting. Agitated saline contrast bubble study was negative, with no evidence of any  interatrial shunt. [name] MD Electronically signed by [name] MD Signature Date/Time: 09/24/2023/5:13:15 PM    Final    VAS [place] LOWER EXTREMITY VENOUS (DVT) Result Date: 09/24/2023  Lower Venous DVT Study Patient Name:  KHYLIE LARMORE [facility]  Date of Exam:   09/24/2023 Medical Rec #: [id number]       Accession #:    [id number] Date of Birth: Jun 30, 1971      Patient Gender: F Patient Age:   2 years Exam Location:  [facility] Procedure:      VAS [place] LOWER EXTREMITY VENOUS (DVT) Referring Phys: Scheryl Marten XU --------------------------------------------------------------------------------  Indications: Stroke.  Comparison Study: No prior exam. Performing Technologist: Fernande Bras  Examination Guidelines: A complete evaluation includes B-mode imaging, spectral Doppler, color Doppler, and power Doppler as needed of all accessible portions of each vessel. Bilateral testing is considered an integral part of a complete examination. Limited examinations for reoccurring indications may be performed as noted. The reflux portion of the exam is performed with the patient in reverse Trendelenburg.  +---------+---------------+---------+-----------+----------+--------------+ RIGHT    CompressibilityPhasicitySpontaneityPropertiesThrombus Aging +---------+---------------+---------+-----------+----------+--------------+ CFV      Full           Yes      Yes                                  +---------+---------------+---------+-----------+----------+--------------+ SFJ      Full           Yes      Yes                                 +---------+---------------+---------+-----------+----------+--------------+ FV Prox  Full                                                        +---------+---------------+---------+-----------+----------+--------------+ FV Mid   Full                                                        +---------+---------------+---------+-----------+----------+--------------+ FV DistalFull                                                        +---------+---------------+---------+-----------+----------+--------------+ PFV      Full                                                        +---------+---------------+---------+-----------+----------+--------------+ POP      Full           Yes      Yes                                 +---------+---------------+---------+-----------+----------+--------------+  PTV      Full                                                        +---------+---------------+---------+-----------+----------+--------------+ PERO     Full                                                        +---------+---------------+---------+-----------+----------+--------------+   +---------+---------------+---------+-----------+----------+--------------+ LEFT     CompressibilityPhasicitySpontaneityPropertiesThrombus Aging +---------+---------------+---------+-----------+----------+--------------+ CFV      Full           Yes      Yes                                 +---------+---------------+---------+-----------+----------+--------------+ SFJ      Full           Yes      Yes                                 +---------+---------------+---------+-----------+----------+--------------+ FV Prox  Full                                                         +---------+---------------+---------+-----------+----------+--------------+ FV Mid   Full                                                        +---------+---------------+---------+-----------+----------+--------------+ FV DistalFull                                                        +---------+---------------+---------+-----------+----------+--------------+ PFV      Full                                                        +---------+---------------+---------+-----------+----------+--------------+ POP      Full           Yes      Yes                                 +---------+---------------+---------+-----------+----------+--------------+ PTV      Full                                                        +---------+---------------+---------+-----------+----------+--------------+  PERO     Full                                                        +---------+---------------+---------+-----------+----------+--------------+    Summary: BILATERAL: - No evidence of deep vein thrombosis seen in the lower extremities, bilaterally. -No evidence of popliteal cyst, bilaterally.   *See table(s) above for measurements and observations.    Preliminary    EP STUDY Result Date: 09/24/2023 See surgical note for result.  ECHOCARDIOGRAM COMPLETE Result Date: 09/23/2023    ECHOCARDIOGRAM REPORT   Patient Name:   FRANCESA EUGENIO [facility] Date of Exam: 09/23/2023 Medical Rec #:  [id number]      Height:       69.0 in Accession #:    [id number]     Weight:       180.0 lb Date of Birth:  [birth year]     BSA:          1.976 m Patient Age:    52 years       BP:           127/113 mmHg Patient Gender: F              HR:           77 bpm. Exam Location:  Inpatient Procedure: 2D Echo, Cardiac Doppler and Color Doppler Indications:    Stroke  History:        Patient has no prior history of Echocardiogram examinations.                 Stroke, Signs/Symptoms:Chest Pain; Risk Factors:Current Smoker.   Sonographer:    JLS Referring Phys: [id number] ARSHAD N KAKRAKANDY IMPRESSIONS  1. Left ventricular ejection fraction, by estimation, is 60 to 65%. The left ventricle has normal function. The left ventricle has no regional wall motion abnormalities. Left ventricular diastolic parameters were normal.  2. Right ventricular systolic function is normal. The right ventricular size is normal. Tricuspid regurgitation signal is inadequate for assessing PA pressure.  3. The mitral valve is normal in structure. No evidence of mitral valve regurgitation. No evidence of mitral stenosis.  4. The aortic valve is normal in structure. Aortic valve regurgitation is not visualized. No aortic stenosis is present.  5. The inferior vena cava is normal in size with greater than 50% respiratory variability, suggesting right atrial pressure of 3 mmHg. Conclusion(s)/Recommendation(s): No intracardiac source of embolism detected on this transthoracic study. Consider a transesophageal echocardiogram to exclude cardiac source of embolism if clinically indicated. FINDINGS  Left Ventricle: Left ventricular ejection fraction, by estimation, is 60 to 65%. The left ventricle has normal function. The left ventricle has no regional wall motion abnormalities. The left ventricular internal cavity size was normal in size. There is  no left ventricular hypertrophy. Left ventricular diastolic parameters were normal. Normal left ventricular filling pressure. Right Ventricle: The right ventricular size is normal. No increase in right ventricular wall thickness. Right ventricular systolic function is normal. Tricuspid regurgitation signal is inadequate for assessing PA pressure. Left Atrium: Left atrial size was normal in size. Right Atrium: Right atrial size was normal in size. Pericardium: There is no evidence of pericardial effusion. Mitral Valve: The mitral valve is normal in structure. No evidence of mitral valve regurgitation. No evidence of mitral valve  stenosis. Tricuspid Valve:  The tricuspid valve is normal in structure. Tricuspid valve regurgitation is not demonstrated. No evidence of tricuspid stenosis. Aortic Valve: The aortic valve is normal in structure. Aortic valve regurgitation is not visualized. No aortic stenosis is present. Aortic valve mean gradient measures 3.0 mmHg. Aortic valve peak gradient measures 5.6 mmHg. Aortic valve area, by VTI measures 2.62 cm. Pulmonic Valve: The pulmonic valve was normal in structure. Pulmonic valve regurgitation is not visualized. No evidence of pulmonic stenosis. Aorta: The aortic root is normal in size and structure. Venous: The inferior vena cava is normal in size with greater than 50% respiratory variability, suggesting right atrial pressure of 3 mmHg. IAS/Shunts: No atrial level shunt detected by color flow Doppler.  LEFT VENTRICLE PLAX 2D LVIDd:         4.10 cm     Diastology LVIDs:         2.70 cm     LV e' medial:    9.46 cm/s LV PW:         0.70 cm     LV E/e' medial:  10.5 LV IVS:        0.70 cm     LV e' lateral:   12.50 cm/s LVOT diam:     2.10 cm     LV E/e' lateral: 8.0 LV SV:         58 LV SV Index:   29 LVOT Area:     3.46 cm  LV Volumes (MOD) LV vol d, MOD A2C: 68.2 ml LV vol d, MOD A4C: 96.8 ml LV vol s, MOD A2C: 20.2 ml LV vol s, MOD A4C: 30.8 ml LV SV MOD A2C:     48.0 ml LV SV MOD A4C:     96.8 ml LV SV MOD BP:      68.0 ml RIGHT VENTRICLE             IVC RV Basal diam:  2.50 cm     IVC diam: 1.70 cm RV S prime:     10.70 cm/s TAPSE (M-mode): 2.2 cm LEFT ATRIUM             Index        RIGHT ATRIUM          Index LA diam:        3.10 cm 1.57 cm/m   RA Area:     6.24 cm LA Vol (A2C):   22.3 ml 11.29 ml/m  RA Volume:   10.60 ml 5.37 ml/m LA Vol (A4C):   19.7 ml 9.97 ml/m LA Biplane Vol: 21.2 ml 10.73 ml/m  AORTIC VALVE                    PULMONIC VALVE AV Area (Vmax):    3.05 cm     PV Vmax:       0.94 m/s AV Area (Vmean):   2.49 cm     PV Peak grad:  3.6 mmHg AV Area (VTI):     2.62 cm AV  Vmax:           118.00 cm/s AV Vmean:          85.900 cm/s AV VTI:            0.222 m AV Peak Grad:      5.6 mmHg AV Mean Grad:      3.0 mmHg LVOT Vmax:         104.00 cm/s LVOT Vmean:  61.800 cm/s LVOT VTI:          0.168 m LVOT/AV VTI ratio: 0.76  AORTA Ao Root diam: 3.10 cm Ao Asc diam:  3.00 cm MITRAL VALVE MV Area (PHT): 5.27 cm    SHUNTS MV Decel Time: 144 msec    Systemic VTI:  0.17 m MV E velocity: 99.80 cm/s  Systemic Diam: 2.10 cm MV A velocity: 78.30 cm/s MV E/A ratio:  1.27 [name] MD Electronically signed by [name] MD Signature Date/Time: 09/23/2023/4:00:27 PM    Final    CT HEAD WO CONTRAST ( ) Result Date: 09/23/2023 CLINICAL DATA:  Stroke follow-up. Petechial bleed noted on recent MRI. EXAM: CT HEAD WITHOUT CONTRAST TECHNIQUE: Contiguous axial images were obtained from the base of the skull through the vertex without intravenous contrast. RADIATION DOSE REDUCTION: This exam was performed according to the departmental dose-optimization program which includes automated exposure control, adjustment of the mA and/or kV according to patient size and/or use of iterative reconstruction technique. COMPARISON:  CT angiography dated 09/23/2023 and MRI dated 09/22/2023. FINDINGS: Brain: Left PCA territory infarct noted. Additional areas of infarct involving the cerebellar hemispheres. There is no acute intracranial hemorrhage. No midline shift. No extra-axial fluid collection. Vascular: No hyperdense vessel or unexpected calcification. Skull: Normal. Negative for fracture or focal lesion. Sinuses/Orbits: No acute finding. 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 No acute intracranial hemorrhage. Electronically Signed   By: Elgie Collard M.D.   On: 09/23/2023 13:13   CT ANGIO HEAD NECK W WO CM Result Date: 09/23/2023 CLINICAL DATA:  Neuro deficit, acute, stroke suspected a centric EXAM: CT ANGIOGRAPHY HEAD AND NECK WITH AND WITHOUT  CONTRAST TECHNIQUE: Multidetector CT imaging of the head and neck was performed using the standard protocol during bolus administration of intravenous contrast. Multiplanar CT image reconstructions and MIPs were obtained to evaluate the vascular anatomy. Carotid stenosis measurements (when applicable) are obtained utilizing NASCET criteria, using the distal internal carotid diameter as the denominator. RADIATION DOSE REDUCTION: This exam was performed according to the departmental dose-optimization program which includes automated exposure control, adjustment of the mA and/or kV according to patient size and/or use of iterative reconstruction technique. CONTRAST:  OMNIPAQUE IOHEXOL 350 MG/ML SOLN COMPARISON:  Same day MRI. FINDINGS: CT HEAD FINDINGS Brain: Known acute infarcts better characterized on same day MRI head. No evidence of progressive mass effect or mass occupying acute hemorrhage. Petechial hemorrhage in the left occipital lobe. No hydrocephalus. No visible mass lesion. Vascular: See below. Skull: No acute fracture. Sinuses/Orbits: Clear sinuses.  No acute orbital findings. Other: No mastoid effusions. Review of the MIP images confirms the above findings CTA NECK FINDINGS Aortic arch: Atherosclerosis.  Great vessel origins are patent. Right carotid system: No evidence of dissection, stenosis (50% or greater), or occlusion. Left carotid system: No evidence of dissection, stenosis (50% or greater), or occlusion. 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 Eccentric intraluminal filling defect at this location (for example see series 13, images 12 through 15). Remainder of the left vertebral artery is patent/are opacified. Right vertebral artery is patent without significant stenosis. Skeleton: No acute abnormality on limited assessment. Other neck: No acute abnormality on limited assessment. Upper chest: Visualized lung apices are clear. Review of the MIP images confirms  the above findings CTA HEAD FINDINGS Anterior circulation: Bilateral intracranial ICAs, MCAs, and ACAs are patent without proximal high-grade stenosis. No aneurysm identified. Posterior circulation: Bilateral intradural vertebral arteries, basilar  artery, and bilateral post cerebral arteries are patent without proximal hemodynamically significant stenosis. No aneurysm identified. Venous sinuses: As permitted by contrast timing, patent. Review of the MIP images confirms the above findings IMPRESSION: 1. 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 Eccentric intraluminal filling defect at this location is suspicious for intramural thrombus. 2. No emergent large vessel occlusion. Electronically Signed   By: Feliberto Harts M.D.   On: 09/23/2023 01:45   MR BRAIN WO CONTRAST Result Date: 09/23/2023 CLINICAL DATA:  Neuro deficit, acute, stroke suspected EXAM: MRI HEAD WITHOUT CONTRAST TECHNIQUE: Multiplanar, multiecho pulse sequences of the brain and surrounding structures were obtained without intravenous contrast. COMPARISON:  CT head 05/08/2023. FINDINGS: Brain: Confluent acute left PCA territory infarct which involves the left occipital lobe as well as the left thalamus and left hippocampus. Associated petechial hemorrhage. Acute infarcts in bilateral cerebellum. Additional punctate acute infarct in the right basal ganglia and right parietal lobe. Areas of associated edema. No midline shift. Vascular: Please see forthcoming CTA head/neck. Skull and upper cervical spine: Normal marrow signal. Sinuses/Orbits: Clear sinuses.  No acute orbital findings. IMPRESSION: 1. Acute confluent left PCA territory infarcts with petechial hemorrhage. 2. Acute bilateral cerebellar infarcts. 3. Punctate acute infarct in the right basal ganglia and right parietal lobe. 4. No midline shift. Electronically Signed   By: Feliberto Harts M.D.   On: 09/23/2023 01:01    Microbiology: No results found for this or any previous  visit (from the past 240 hours).   Labs: Basic Metabolic Panel: Recent Labs  Lab 09/22/23 2224 09/22/23 2238 09/23/23 0403 09/24/23 1125 09/25/23 0636  NA 137 138  --  137 141  K 3.5 3.8  --  3.6 3.9  CL 101 103  --  102 107  CO2 24  --   --  23 23  GLUCOSE 133* 131*  --  133* 111*  BUN 19 19  --  17 18  CREATININE 0.77 0.80 0.74 0.82 1.04*  CALCIUM 8.9  --   --  8.7* 8.8*  MG 2.2  --   --  2.1  --    Liver Function Tests: Recent Labs  Lab 09/22/23 2224  AST 46*  ALT 36  ALKPHOS 79  BILITOT 0.8  PROT 6.9  ALBUMIN 3.8   No results for input(s): "LIPASE", "AMYLASE" in the last 168 hours. No results for input(s): "AMMONIA" in the last 168 hours. CBC: Recent Labs  Lab 09/22/23 2224 09/22/23 2238 09/23/23 0403 09/24/23 0930 09/25/23 0636  WBC 12.1*  --  11.5* 9.5 8.1  NEUTROABS 8.5*  --   --   --   --   HGB 14.6 15.0 14.5 14.7 14.4  HCT 44.0 44.0 42.8 42.9 42.2  MCV 96.7  --  95.3 94.1 94.8  PLT 281  --  270 310 312   Cardiac Enzymes: No results for input(s): "CKTOTAL", "CKMB", "CKMBINDEX", "TROPONINI" in the last 168 hours. BNP: BNP (last 3 results) No results for input(s): "BNP" in the last 8760 hours.  ProBNP (last 3 results) No results for input(s): "PROBNP" in the last 8760 hours.  CBG: No results for input(s): "GLUCAP" in the last 168 hours.     Signed:  Ramiro Harvest MD.  [facility] 09/25/2023, 2:48 PM
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
## 2023-09-25 NOTE — Progress Notes (Signed)
STROKE TEAM PROGRESS NOTE   SUBJECTIVE (INTERVAL HISTORY) Husband is at the bedside. Pt lying in bed, no complains. Had eliquis last night and this morning, tolerating well.    OBJECTIVE Temp:  [97.8 F (36.6 C)-98.5 F (36.9 C)] 98.5 F (36.9 C) (02/01 1500) Pulse Rate:  [61-81] 80 (02/01 1500) Cardiac Rhythm: Normal sinus rhythm (02/01 0701) Resp:  [16-18] 18 (02/01 1500) BP: (94-135)/(54-113) 135/113 (02/01 1500) SpO2:  [95 %-97 %] 97 % (02/01 1112)  No results for input(s): "GLUCAP" in the last 168 hours. Recent Labs  Lab 09/22/23 2224 09/22/23 2238 09/23/23 0403 09/24/23 1125 09/25/23 0636  NA 137 138  --  137 141  K 3.5 3.8  --  3.6 3.9  CL 101 103  --  102 107  CO2 24  --   --  23 23  GLUCOSE 133* 131*  --  133* 111*  BUN 19 19  --  17 18  CREATININE 0.77 0.80 0.74 0.82 1.04*  CALCIUM 8.9  --   --  8.7* 8.8*  MG 2.2  --   --  2.1  --    Recent Labs  Lab 09/22/23 2224  AST 46*  ALT 36  ALKPHOS 79  BILITOT 0.8  PROT 6.9  ALBUMIN 3.8   Recent Labs  Lab 09/22/23 2224 09/22/23 2238 09/23/23 0403 09/24/23 0930 09/25/23 0636  WBC 12.1*  --  11.5* 9.5 8.1  NEUTROABS 8.5*  --   --   --   --   HGB 14.6 15.0 14.5 14.7 14.4  HCT 44.0 44.0 42.8 42.9 42.2  MCV 96.7  --  95.3 94.1 94.8  PLT 281  --  270 310 312   No results for input(s): "CKTOTAL", "CKMB", "CKMBINDEX", "TROPONINI" in the last 168 hours. Recent Labs    09/22/23 2224  LABPROT 13.3  INR 1.0   Recent Labs    09/23/23 0215  COLORURINE STRAW*  LABSPEC 1.023  PHURINE 6.0  GLUCOSEU NEGATIVE  HGBUR MODERATE*  BILIRUBINUR NEGATIVE  KETONESUR NEGATIVE  PROTEINUR NEGATIVE  NITRITE NEGATIVE  LEUKOCYTESUR NEGATIVE       Component Value Date/Time   CHOL 228 (H) 09/23/2023 0403   TRIG 206 (H) 09/23/2023 0403   HDL 35 (L) 09/23/2023 0403   CHOLHDL 6.5 09/23/2023 0403   VLDL 41 (H) 09/23/2023 0403   LDLCALC 152 (H) 09/23/2023 0403   Lab Results  Component Value Date   HGBA1C 6.5 (H)  09/23/2023      Component Value Date/Time   LABOPIA NONE DETECTED 09/23/2023 0215   COCAINSCRNUR NONE DETECTED 09/23/2023 0215   LABBENZ NONE DETECTED 09/23/2023 0215   AMPHETMU NONE DETECTED 09/23/2023 0215   THCU NONE DETECTED 09/23/2023 0215   LABBARB NONE DETECTED 09/23/2023 0215    Recent Labs  Lab 09/22/23 2224  ETH <10    I have personally reviewed the radiological images below and agree with the radiology interpretations.  VAS [place] LOWER EXTREMITY VENOUS (DVT) Result Date: 09/25/2023  Lower Venous DVT Study Patient Name:  MADDY GRAHAM [facility]  Date of Exam:   09/24/2023 Medical Rec #: [id number]       Accession #:    [id number] Date of Birth: May 14, 1971      Patient Gender: F Patient Age:   53 years Exam Location:  [facility] Procedure:      VAS [place] LOWER EXTREMITY VENOUS (DVT) Referring Phys: Scheryl Marten Janeli Lewison --------------------------------------------------------------------------------  Indications: Stroke.  Comparison Study: No prior exam. Performing Technologist: Alycia Patten  [facility]  Examination Guidelines: A complete evaluation includes B-mode imaging, spectral Doppler, color Doppler, and power Doppler as needed of all accessible portions of each vessel. Bilateral testing is considered an integral part of a complete examination. Limited examinations for reoccurring indications may be performed as noted. The reflux portion of the exam is performed with the patient in reverse Trendelenburg.  +---------+---------------+---------+-----------+----------+--------------+ RIGHT    CompressibilityPhasicitySpontaneityPropertiesThrombus Aging +---------+---------------+---------+-----------+----------+--------------+ CFV      Full           Yes      Yes                                 +---------+---------------+---------+-----------+----------+--------------+ SFJ      Full           Yes      Yes                                  +---------+---------------+---------+-----------+----------+--------------+ FV Prox  Full                                                        +---------+---------------+---------+-----------+----------+--------------+ FV Mid   Full                                                        +---------+---------------+---------+-----------+----------+--------------+ FV DistalFull                                                        +---------+---------------+---------+-----------+----------+--------------+ PFV      Full                                                        +---------+---------------+---------+-----------+----------+--------------+ POP      Full           Yes      Yes                                 +---------+---------------+---------+-----------+----------+--------------+ PTV      Full                                                        +---------+---------------+---------+-----------+----------+--------------+ PERO     Full                                                        +---------+---------------+---------+-----------+----------+--------------+   +---------+---------------+---------+-----------+----------+--------------+  LEFT     CompressibilityPhasicitySpontaneityPropertiesThrombus Aging +---------+---------------+---------+-----------+----------+--------------+ CFV      Full           Yes      Yes                                 +---------+---------------+---------+-----------+----------+--------------+ SFJ      Full           Yes      Yes                                 +---------+---------------+---------+-----------+----------+--------------+ FV Prox  Full                                                        +---------+---------------+---------+-----------+----------+--------------+ FV Mid   Full                                                         +---------+---------------+---------+-----------+----------+--------------+ FV DistalFull                                                        +---------+---------------+---------+-----------+----------+--------------+ PFV      Full                                                        +---------+---------------+---------+-----------+----------+--------------+ POP      Full           Yes      Yes                                 +---------+---------------+---------+-----------+----------+--------------+ PTV      Full                                                        +---------+---------------+---------+-----------+----------+--------------+ PERO     Full                                                        +---------+---------------+---------+-----------+----------+--------------+     Summary: BILATERAL: - No evidence of deep vein thrombosis seen in the lower extremities, bilaterally. -No evidence of popliteal cyst, bilaterally.   *See table(s) above for measurements and observations. Electronically signed by Lemar Livings MD on 09/25/2023 at 4:04:15 PM.    Final    DG  CHEST PORT 1 VIEW Result Date: 09/25/2023 CLINICAL DATA:  Shortness of breath. EXAM: PORTABLE CHEST 1 VIEW COMPARISON:  May 08, 2023. FINDINGS: The heart size and mediastinal contours are within normal limits. Right lung is clear. Mild left basilar subsegmental atelectasis or infiltrate is noted. The visualized skeletal structures are unremarkable. IMPRESSION: Mild left basilar subsegmental atelectasis or infiltrate is noted. Electronically Signed   By: Lupita Raider M.D.   On: 09/25/2023 15:39   ECHO TEE Result Date: 09/24/2023    TRANSESOPHOGEAL ECHO REPORT   Patient Name:   DEIDREA GAETZ [facility] Date of Exam: 09/24/2023 Medical Rec #:  [id number]      Height:       69.0 in Accession #:    [id number]     Weight:       180.0 lb Date of Birth:  07/31/1971     BSA:          1.976 m Patient Age:    52 years        BP:           144/104 mmHg Patient Gender: F              HR:           69 bpm. Exam Location:  Inpatient Procedure: Color Doppler, Cardiac Doppler, Transesophageal Echo and Saline            Contrast Bubble Study Indications:     stroke  History:         Patient has prior history of Echocardiogram examinations, most                  recent 09/23/2023. Risk Factors:Current Smoker.  Sonographer:     Delcie Roch RDCS Referring Phys:  [phone] TRACI R TURNER Diagnosing Phys: [name] MD PROCEDURE: After discussion of the risks and benefits of a TEE, an informed consent was obtained from the patient. TEE procedure time was 10 minutes. The transesophogeal probe was passed without difficulty through the esophogus of the patient. Imaged were obtained with the patient in a left lateral decubitus position. Sedation performed by different physician. The patient was monitored while under deep sedation. Anesthestetic sedation was provided intravenously by Anesthesiology: 186mg  of Propofol, 60mg  of Lidocaine. The patient's vital signs; including heart rate, blood pressure, and oxygen saturation; remained stable throughout the procedure. The patient developed no complications during the procedure.  IMPRESSIONS  1. Left ventricular ejection fraction, by estimation, is 60 to 65%. The left ventricle has normal function. The left ventricle has no regional wall motion abnormalities.  2. Right ventricular systolic function is normal. The right ventricular size is normal.  3. No left atrial/left atrial appendage thrombus was detected.  4. The mitral valve is normal in structure. Trivial mitral valve regurgitation. No evidence of mitral stenosis.  5. 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 If endocarditis is a concern recommend checking inflammatory markers and blood cultures.     . The aortic  valve is tricuspid. Aortic valve regurgitation is not visualized. No aortic stenosis is present.  6. The inferior vena cava is normal in size with greater than 50% respiratory variability, suggesting right atrial pressure of 3 mmHg.  7. Agitated saline contrast bubble study was negative, with no evidence of any interatrial shunt. Conclusion(s)/Recommendation(s): Normal biventricular function without evidence of hemodynamically significant valvular  heart disease. FINDINGS  Left Ventricle: Left ventricular ejection fraction, by estimation, is 60 to 65%. The left ventricle has normal function. The left ventricle has no regional wall motion abnormalities. The left ventricular internal cavity size was normal in size. There is  no left ventricular hypertrophy. Right Ventricle: The right ventricular size is normal. No increase in right ventricular wall thickness. Right ventricular systolic function is normal. Left Atrium: Left atrial size was normal in size. No left atrial/left atrial appendage thrombus was detected. Right Atrium: Right atrial size was normal in size. Pericardium: There is no evidence of pericardial effusion. Mitral Valve: The mitral valve is normal in structure. Trivial mitral valve regurgitation. No evidence of mitral valve stenosis. Tricuspid Valve: The tricuspid valve is normal in structure. Tricuspid valve regurgitation is trivial. No evidence of tricuspid stenosis. 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 If endocarditis is a concern recommend checking inflammatory markers and blood cultures. The aortic valve is tricuspid. Aortic valve regurgitation is not visualized. No aortic stenosis is present. Pulmonic Valve: The pulmonic valve was normal in structure. Pulmonic valve regurgitation is not visualized. No evidence of pulmonic stenosis. Aorta: The  aortic root is normal in size and structure. Venous: The inferior vena cava is normal in size with greater than 50% respiratory variability, suggesting right atrial pressure of 3 mmHg. IAS/Shunts: No atrial level shunt detected by color flow Doppler. Agitated saline contrast was given intravenously to evaluate for intracardiac shunting. Agitated saline contrast bubble study was negative, with no evidence of any interatrial shunt. [name] MD Electronically signed by [name] MD Signature Date/Time: 09/24/2023/5:13:15 PM    Final    EP STUDY Result Date: 09/24/2023 See surgical note for result.  ECHOCARDIOGRAM COMPLETE Result Date: 09/23/2023    ECHOCARDIOGRAM REPORT   Patient Name:   ALSACE DOWD [facility] Date of Exam: 09/23/2023 Medical Rec #:  [id number]      Height:       69.0 in Accession #:    [id number]     Weight:       180.0 lb Date of Birth:  August 07, 1971     BSA:          1.976 m Patient Age:    52 years       BP:           127/113 mmHg Patient Gender: F              HR:           77 bpm. Exam Location:  Inpatient Procedure: 2D Echo, Cardiac Doppler and Color Doppler Indications:    Stroke  History:        Patient has no prior history of Echocardiogram examinations.                 Stroke, Signs/Symptoms:Chest Pain; Risk Factors:Current Smoker.  Sonographer:    JLS Referring Phys: [id number] ARSHAD N KAKRAKANDY IMPRESSIONS  1. Left ventricular ejection fraction, by estimation, is 60 to 65%. The left ventricle has normal function. The left ventricle has no regional wall motion abnormalities. Left ventricular diastolic parameters were normal.  2. Right ventricular systolic function is normal. The right ventricular size is normal. Tricuspid regurgitation signal is inadequate for assessing PA pressure.  3. The mitral valve is normal in structure. No evidence of mitral valve regurgitation. No evidence of  mitral stenosis.  4. The aortic valve is normal in structure. Aortic valve regurgitation is not visualized. No  aortic stenosis is present.  5. The inferior vena cava is normal in size with greater than 50% respiratory variability, suggesting right atrial pressure of 3 mmHg. Conclusion(s)/Recommendation(s): No intracardiac source of embolism detected on this transthoracic study. Consider a transesophageal echocardiogram to exclude cardiac source of embolism if clinically indicated. FINDINGS  Left Ventricle: Left ventricular ejection fraction, by estimation, is 60 to 65%. The left ventricle has normal function. The left ventricle has no regional wall motion abnormalities. The left ventricular internal cavity size was normal in size. There is  no left ventricular hypertrophy. Left ventricular diastolic parameters were normal. Normal left ventricular filling pressure. Right Ventricle: The right ventricular size is normal. No increase in right ventricular wall thickness. Right ventricular systolic function is normal. Tricuspid regurgitation signal is inadequate for assessing PA pressure. Left Atrium: Left atrial size was normal in size. Right Atrium: Right atrial size was normal in size. Pericardium: There is no evidence of pericardial effusion. Mitral Valve: The mitral valve is normal in structure. No evidence of mitral valve regurgitation. No evidence of mitral valve stenosis. Tricuspid Valve: The tricuspid valve is normal in structure. Tricuspid valve regurgitation is not demonstrated. No evidence of tricuspid stenosis. Aortic Valve: The aortic valve is normal in structure. Aortic valve regurgitation is not visualized. No aortic stenosis is present. Aortic valve mean gradient measures 3.0 mmHg. Aortic valve peak gradient measures 5.6 mmHg. Aortic valve area, by VTI measures 2.62 cm. Pulmonic Valve: The pulmonic valve was normal in structure. Pulmonic valve regurgitation is not visualized. No evidence of pulmonic stenosis. Aorta: The aortic root is normal in size and structure. Venous: The inferior vena cava is normal in size  with greater than 50% respiratory variability, suggesting right atrial pressure of 3 mmHg. IAS/Shunts: No atrial level shunt detected by color flow Doppler.  LEFT VENTRICLE PLAX 2D LVIDd:         4.10 cm     Diastology LVIDs:         2.70 cm     LV e' medial:    9.46 cm/s LV PW:         0.70 cm     LV E/e' medial:  10.5 LV IVS:        0.70 cm     LV e' lateral:   12.50 cm/s LVOT diam:     2.10 cm     LV E/e' lateral: 8.0 LV SV:         58 LV SV Index:   29 LVOT Area:     3.46 cm  LV Volumes (MOD) LV vol d, MOD A2C: 68.2 ml LV vol d, MOD A4C: 96.8 ml LV vol s, MOD A2C: 20.2 ml LV vol s, MOD A4C: 30.8 ml LV SV MOD A2C:     48.0 ml LV SV MOD A4C:     96.8 ml LV SV MOD BP:      68.0 ml RIGHT VENTRICLE             IVC RV Basal diam:  2.50 cm     IVC diam: 1.70 cm RV S prime:     10.70 cm/s TAPSE (M-mode): 2.2 cm LEFT ATRIUM             Index        RIGHT ATRIUM          Index LA diam:  3.10 cm 1.57 cm/m   RA Area:     6.24 cm LA Vol (A2C):   22.3 ml 11.29 ml/m  RA Volume:   10.60 ml 5.37 ml/m LA Vol (A4C):   19.7 ml 9.97 ml/m LA Biplane Vol: 21.2 ml 10.73 ml/m  AORTIC VALVE                    PULMONIC VALVE AV Area (Vmax):    3.05 cm     PV Vmax:       0.94 m/s AV Area (Vmean):   2.49 cm     PV Peak grad:  3.6 mmHg AV Area (VTI):     2.62 cm AV Vmax:           118.00 cm/s AV Vmean:          85.900 cm/s AV VTI:            0.222 m AV Peak Grad:      5.6 mmHg AV Mean Grad:      3.0 mmHg LVOT Vmax:         104.00 cm/s LVOT Vmean:        61.800 cm/s LVOT VTI:          0.168 m LVOT/AV VTI ratio: 0.76  AORTA Ao Root diam: 3.10 cm Ao Asc diam:  3.00 cm MITRAL VALVE MV Area (PHT): 5.27 cm    SHUNTS MV Decel Time: 144 msec    Systemic VTI:  0.17 m MV E velocity: 99.80 cm/s  Systemic Diam: 2.10 cm MV A velocity: 78.30 cm/s MV E/A ratio:  1.27 [name] MD Electronically signed by [name] MD Signature Date/Time: 09/23/2023/4:00:27 PM    Final    CT HEAD WO CONTRAST ( ) Result Date: 09/23/2023 CLINICAL  DATA:  Stroke follow-up. Petechial bleed noted on recent MRI. EXAM: CT HEAD WITHOUT CONTRAST TECHNIQUE: Contiguous axial images were obtained from the base of the skull through the vertex without intravenous contrast. RADIATION DOSE REDUCTION: This exam was performed according to the departmental dose-optimization program which includes automated exposure control, adjustment of the mA and/or kV according to patient size and/or use of iterative reconstruction technique. COMPARISON:  CT angiography dated 09/23/2023 and MRI dated 09/22/2023. FINDINGS: Brain: Left PCA territory infarct noted. Additional areas of infarct involving the cerebellar hemispheres. There is no acute intracranial hemorrhage. No midline shift. No extra-axial fluid collection. Vascular: No hyperdense vessel or unexpected calcification. Skull: Normal. Negative for fracture or focal lesion. Sinuses/Orbits: No acute finding. 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 No acute intracranial hemorrhage. Electronically Signed   By: Elgie Collard M.D.   On: 09/23/2023 13:13   CT ANGIO HEAD NECK W WO CM Result Date: 09/23/2023 CLINICAL DATA:  Neuro deficit, acute, stroke suspected a centric EXAM: CT ANGIOGRAPHY HEAD AND NECK WITH AND WITHOUT CONTRAST TECHNIQUE: Multidetector CT imaging of the head and neck was performed using the standard protocol during bolus administration of intravenous contrast. Multiplanar CT image reconstructions and MIPs were obtained to evaluate the vascular anatomy. Carotid stenosis measurements (when applicable) are obtained utilizing NASCET criteria, using the distal internal carotid diameter as the denominator. RADIATION DOSE REDUCTION: This exam was performed according to the departmental dose-optimization program which includes automated exposure control, adjustment of the mA and/or kV according to patient size and/or use of iterative reconstruction technique. CONTRAST:   OMNIPAQUE IOHEXOL 350 MG/ML SOLN COMPARISON:  Same day MRI. FINDINGS: CT HEAD  FINDINGS Brain: Known acute infarcts better characterized on same day MRI head. No evidence of progressive mass effect or mass occupying acute hemorrhage. Petechial hemorrhage in the left occipital lobe. No hydrocephalus. No visible mass lesion. Vascular: See below. Skull: No acute fracture. Sinuses/Orbits: Clear sinuses.  No acute orbital findings. Other: No mastoid effusions. Review of the MIP images confirms the above findings CTA NECK FINDINGS Aortic arch: Atherosclerosis.  Great vessel origins are patent. Right carotid system: No evidence of dissection, stenosis (50% or greater), or occlusion. Left carotid system: No evidence of dissection, stenosis (50% or greater), or occlusion. 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 Eccentric intraluminal filling defect at this location (for example see series 13, images 12 through 15). Remainder of the left vertebral artery is patent/are opacified. Right vertebral artery is patent without significant stenosis. Skeleton: No acute abnormality on limited assessment. Other neck: No acute abnormality on limited assessment. Upper chest: Visualized lung apices are clear. Review of the MIP images confirms the above findings CTA HEAD FINDINGS Anterior circulation: Bilateral intracranial ICAs, MCAs, and ACAs are patent without proximal high-grade stenosis. No aneurysm identified. Posterior circulation: Bilateral intradural vertebral arteries, basilar artery, and bilateral post cerebral arteries are patent without proximal hemodynamically significant stenosis. No aneurysm identified. Venous sinuses: As permitted by contrast timing, patent. Review of the MIP images confirms the above findings IMPRESSION: 1. 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 Eccentric intraluminal filling defect at this location is suspicious for intramural thrombus. 2. No emergent  large vessel occlusion. Electronically Signed   By: Feliberto Harts M.D.   On: 09/23/2023 01:45   MR BRAIN WO CONTRAST Result Date: 09/23/2023 CLINICAL DATA:  Neuro deficit, acute, stroke suspected EXAM: MRI HEAD WITHOUT CONTRAST TECHNIQUE: Multiplanar, multiecho pulse sequences of the brain and surrounding structures were obtained without intravenous contrast. COMPARISON:  CT head 05/08/2023. FINDINGS: Brain: Confluent acute left PCA territory infarct which involves the left occipital lobe as well as the left thalamus and left hippocampus. Associated petechial hemorrhage. Acute infarcts in bilateral cerebellum. Additional punctate acute infarct in the right basal ganglia and right parietal lobe. Areas of associated edema. No midline shift. Vascular: Please see forthcoming CTA head/neck. Skull and upper cervical spine: Normal marrow signal. Sinuses/Orbits: Clear sinuses.  No acute orbital findings. IMPRESSION: 1. Acute confluent left PCA territory infarcts with petechial hemorrhage. 2. Acute bilateral cerebellar infarcts. 3. Punctate acute infarct in the right basal ganglia and right parietal lobe. 4. No midline shift. Electronically Signed   By: Feliberto Harts M.D.   On: 09/23/2023 01:01     PHYSICAL EXAM  Temp:  [97.8 F (36.6 C)-98.5 F (36.9 C)] 98.5 F (36.9 C) (02/01 1500) Pulse Rate:  [61-81] 80 (02/01 1500) Resp:  [16-18] 18 (02/01 1500) BP: (94-135)/(54-113) 135/113 (02/01 1500) SpO2:  [95 %-97 %] 97 % (02/01 1112)  General - Well nourished, well developed, in no apparent distress.  Ophthalmologic - fundi not visualized due to noncooperation.  Cardiovascular - Regular rhythm and rate.  Mental Status -  Level of arousal and orientation to time, place, and person were intact. Language including expression, naming, repetition, comprehension was assessed and found intact. Fund of Knowledge was assessed and was intact.  Cranial Nerves II - XII - II - Visual field test showed  left hemianopia but able to see hand waving on left upper quadrant III, IV, VI - Extraocular movements intact. V - Facial sensation intact bilaterally. VII - Facial movement intact bilaterally. VIII -  Hearing & vestibular intact bilaterally. X - Palate elevates symmetrically. XI - Chin turning & shoulder shrug intact bilaterally. XII - Tongue protrusion intact.  Motor Strength - The patient's strength was normal in all extremities and pronator drift was absent.  Bulk was normal and fasciculations were absent.   Motor Tone - Muscle tone was assessed at the neck and appendages and was normal.  Reflexes - The patient's reflexes were symmetrical in all extremities and she had no pathological reflexes.  Sensory - Light touch, temperature/pinprick were assessed and were symmetrical.    Coordination - The patient had normal movements in the hands and feet with no ataxia or dysmetria.  Tremor was absent.  Gait and Station - deferred.   ASSESSMENT/PLAN Ms. Kristen Rowland is a 53 y.o. female with history of current smoker and alcohol abuse admitted for right hemianopia with headache. No TNK given due to outside window.    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  No PFO. LE venous Doppler no DVT LDL 152 HgbA1c 6.5 UDS negative Lovenox for VTE prophylaxis No antithrombotic prior to admission, now on aspirin 81 mg daily and clopidogrel 75 mg daily.  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  Repeat CTA in 2 months, if left VA thrombus resolves, may switch Eliquis to antiplatelet. 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  Given AV Lambl's excrescence and left VA thrombus with cardioembolic stroke pattern, started on Eliquis. Follow-up with cardiology as outpatient to determine the duration of eliquis  Pre-diabetes HgbA1c 6.5 goal < 7.0 CBG monitoring SSI DM education and close PCP follow up  Hypertension Stable with fluctuation Long term BP goal normotensive  Hyperlipidemia Home meds: None LDL 152, goal < 70 Now on Lipitor 80 Continue statin at discharge  Tobacco abuse Current smoker Smoking cessation counseling provided Pt is willing to quit  Alcohol use Last drink 1 week ago Patient willing to continue abstain from alcohol Onb1/FA/MVI  Other Stroke Risk Factors   Other Active Problems Mild leukocytosis, WBC 11.5  Hospital day # 2  Neurology will sign off. Please call with questions. Pt will follow up with stroke clinic NP at [facility] in about 4 weeks. Thanks for the consult.   Marvel Plan, MD PhD Stroke Neurology 09/25/2023 9:42 PM    To contact Stroke Continuity provider, please refer to [email or web address]. After hours, contact General Neurology

## 2023-09-26 LAB — BETA-2-GLYCOPROTEIN I ABS, IGG/M/A
Beta-2 Glyco I IgG: <9 GPI IgG units (ref 0–20)
Beta-2-Glycoprotein I IgA: <9 GPI IgA units (ref 0–25)
Beta-2-Glycoprotein I IgM: 15 GPI IgM units (ref 0–32)

## 2023-09-26 LAB — CARDIOLIPIN ANTIBODIES, IGG, IGM, IGA
Anticardiolipin IgA: <9 [APL'U]/mL (ref 0–11)
Anticardiolipin IgG: <9 [GPL'U]/mL (ref 0–14)
Anticardiolipin IgM: 35 [MPL'U]/mL — ABNORMAL HIGH (ref 0–12)

## 2023-09-26 LAB — PROTEIN C, TOTAL: Protein C, Total: 130 % (ref 60–150)

## 2023-09-27 ENCOUNTER — Encounter (HOSPITAL_COMMUNITY): Payer: Self-pay | Admitting: Cardiology

## 2023-09-27 ENCOUNTER — Telehealth: Payer: MEDICAID | Admitting: Family Medicine

## 2023-09-27 DIAGNOSIS — I639 Cerebral infarction, unspecified: Secondary | ICD-10-CM

## 2023-09-27 NOTE — Progress Notes (Signed)
Pt reports the hospital made her this apptmt and she doesn't know why. She thought it was a neurology apptmt. No charge. Follow up with neuro Wednesday. DWB

## 2023-09-28 ENCOUNTER — Ambulatory Visit: Payer: Self-pay | Admitting: General Practice

## 2023-09-28 NOTE — Telephone Encounter (Signed)
  Chief Complaint: headache Symptoms: headache, bilateral eye pain Frequency: ongoing since 09/20/23 Pertinent Negatives: Patient denies fever, sore throat, cold symptoms, confusion, unilateral weakness or numbness, changes in speech, LOC, new changes in vision Disposition: [x] ED /[x] Urgent Care (no appt availability in office) / [] Appointment(In office/virtual)/ []  [place] [facility]/ [] Home Care/ [] Refused Recommended Disposition /[] [place] [facility]/ []  Follow-up with PCP Additional Notes: Patient states she had a stroke on Monday 09/20/23 and was admitted on 09/22/23. Patient state she has had a headache since 09/20/23 and was told she can take aspirin  for her headache.  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 I'm probably too intelligent. Redirected patient to triage assessment. Patient states the headache has been present since her stroke and has not improved. Patient scheduled by agent for new patient appointment. Advised patient to go to urgent care or ED. Patient requesting assistance with transportation to urgent care or ED. Patient provided phone number to closest [facility] urgent care and advised to seek care within 24 hours.  Copied from CRM [id number]. Topic: Clinical - Red Word Triage >> Sep 28, 2023  3:48 PM Elle L wrote: Red Word that prompted transfer to Nurse Triage: The patient was discharged from the hospital 2/1 as she had a stroke and is having migraines and severe pain in her head and eyes. Reason for Disposition  [1] MODERATE headache (e.g., interferes with normal activities) AND [2] present > 24 hours AND [3] unexplained  (Exceptions: analgesics not tried, typical migraine, or headache part of viral illness)  Answer Assessment - Initial Assessment Questions 1. LOCATION: Where does it hurt?      Head, front it hurts everywhere.  2. ONSET: When did the headache start? (Minutes, hours or days)       Monday 09/20/23.  3. PATTERN: Does the pain come and go, or has it been constant since it started?     Constant.  4. SEVERITY: How bad is the pain? and What does it keep you from doing?  (e.g., Scale 1-10; mild, moderate, or severe)   - MILD (1-3): doesn't interfere with normal activities    - MODERATE (4-7): interferes with normal activities or awakens from sleep    - SEVERE (8-10): excruciating pain, unable to do any normal activities        5/10 most of the time and is now a 7/10.  5. RECURRENT SYMPTOM: Have you ever had headaches before? If Yes, ask: When was the last time? and What happened that time?      Denies.  6. CAUSE: What do you think is causing the headache?     Recently diagnosed with stroke and admitted to the hospital on 09/22/23.  7. MIGRAINE: Have you been diagnosed with migraine headaches? If Yes, ask: Is this headache similar?      No.  8. HEAD INJURY: Has there been any recent injury to the head?      Denies.  9. OTHER SYMPTOMS: Do you have any other symptoms? (fever, stiff neck, eye pain, sore throat, cold symptoms)     Shooting eye pain in both (worse in right eye).  Protocols used: [facility]

## 2023-09-29 ENCOUNTER — Ambulatory Visit: Payer: MEDICAID | Admitting: Occupational Therapy

## 2023-09-30 LAB — PROTHROMBIN GENE MUTATION

## 2023-09-30 LAB — FACTOR 5 LEIDEN

## 2023-10-01 ENCOUNTER — Ambulatory Visit: Payer: MEDICAID | Admitting: Internal Medicine

## 2023-10-05 ENCOUNTER — Telehealth: Payer: MEDICAID

## 2023-10-19 ENCOUNTER — Ambulatory Visit (INDEPENDENT_AMBULATORY_CARE_PROVIDER_SITE_OTHER): Payer: MEDICAID | Admitting: Family Medicine

## 2023-10-19 VITALS — BP 94/58 | HR 77 | Temp 98.6°F | Resp 16 | Ht 69.75 in | Wt 197.6 lb

## 2023-10-19 DIAGNOSIS — G4700 Insomnia, unspecified: Secondary | ICD-10-CM | POA: Diagnosis not present

## 2023-10-19 DIAGNOSIS — F4321 Adjustment disorder with depressed mood: Secondary | ICD-10-CM | POA: Diagnosis not present

## 2023-10-19 DIAGNOSIS — D582 Other hemoglobinopathies: Secondary | ICD-10-CM | POA: Insufficient documentation

## 2023-10-19 DIAGNOSIS — H539 Unspecified visual disturbance: Secondary | ICD-10-CM

## 2023-10-19 DIAGNOSIS — F17200 Nicotine dependence, unspecified, uncomplicated: Secondary | ICD-10-CM

## 2023-10-19 DIAGNOSIS — Z7689 Persons encountering health services in other specified circumstances: Secondary | ICD-10-CM

## 2023-10-19 DIAGNOSIS — Z8673 Personal history of transient ischemic attack (TIA), and cerebral infarction without residual deficits: Secondary | ICD-10-CM | POA: Diagnosis not present

## 2023-10-19 DIAGNOSIS — Z09 Encounter for follow-up examination after completed treatment for conditions other than malignant neoplasm: Secondary | ICD-10-CM

## 2023-10-19 NOTE — Progress Notes (Unsigned)
 Patient is here to established care with provider. ~health hx address ~care gaps address HFU
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
## 2023-10-20 ENCOUNTER — Ambulatory Visit: Payer: Self-pay | Admitting: Family Medicine

## 2023-10-20 ENCOUNTER — Other Ambulatory Visit (HOSPITAL_COMMUNITY): Payer: Self-pay

## 2023-10-20 NOTE — Telephone Encounter (Signed)
 Copied from CRM [id number]. Topic: Clinical - Red Word Triage >> Oct 20, 2023 12:31 PM Martha Clan wrote: Red Word that prompted transfer to Nurse Triage:  Recent stroke, increased anxiety and headaches. Also decrease in blood pressure causing severe fatigue. Memory issues. Reason for Disposition  [1] Numbness or tingling on both sides of body AND [2] is a new symptom present > 24 hours    Generalized fatigue, anxiety, headache, memory is not as good.  Answer Assessment - Initial Assessment Questions 1. SYMPTOM: "What is the main symptom you are concerned about?" (e.g., weakness, numbness)     I'm having memory issues.   I'm having anxiety.   My BP is low.   I've been very tired.  Had a stroke on 09/25/2023 I was in hospital.  Maybe Jan. 31, 2025.   My memory is getting worse and especially today.    2. ONSET: "When did this start?" (minutes, hours, days; while sleeping)     Today extra fatigue and confusion, headache but it's not new.   She doesn't have a BP cuff.   The fatigue is new the last couple of days.    Anxiety too.   No reason for it.   It's just there.   I don't have any issues going on to call it.     3. LAST NORMAL: "When was the last time you (the patient) were normal (no symptoms)?"     The last couple of days I've not felt well.    I'm using a cheat sheet to help me remember.    4. PATTERN "Does this come and go, or has it been constant since it started?"  "Is it present now?"     The last couple of days. No URI symptoms. 5. CARDIAC SYMPTOMS: "Have you had any of the following symptoms: chest pain, difficulty breathing, palpitations?"     Not asked 6. NEUROLOGIC SYMPTOMS: "Have you had any of the following symptoms: headache, dizziness, vision loss, double vision, changes in speech, unsteady on your feet?"     Yes I had a stroke at the end of Jan. 2025 7. OTHER SYMPTOMS: "Do you have any other symptoms?"     See above  8. PREGNANCY: "Is there any chance you are pregnant?" "When was  your last menstrual period?"     N/A  Protocols used: Neurologic Deficit-A-AH  Chief Complaint: overall fatigue since her stroke but it's worse over the last 2 days, memory is worse, feels like her BP is low but does not have a way to check it.  Anxiety since the stroke and a headache that is low grade and constant since her stroke.   "I feel like I have rocks in my arms and legs". Symptoms: see above   Frequency: the last 2 days she has been more fatigued and noticing her memory is not as good and having anxiety.  Pertinent Negatives: Patient denies being sick with anything like URI.   Disposition: [] ED /[] Urgent Care (no appt availability in office) / [x] Appointment(In office/virtual)/ []  [place] [facility]/ [] Home Care/ [] Refused Recommended Disposition /[] [place] [facility]/ []  Follow-up with PCP Additional Notes: Pt actually has 2 appts set up.   I was able to get her in to see Gwinda Passe, NP on 10/26/2023 at 9:50.   The agent got her set up with a virtual visit with Dr. Georganna Skeans for 11/10/2023.   I have left this appt in case she needs follow up since Dr.  Andrey Campanile tends to stay booked out a ways.   Pt. Was agreeable to this plan.

## 2023-10-21 ENCOUNTER — Telehealth: Payer: Self-pay

## 2023-10-21 ENCOUNTER — Encounter: Payer: Self-pay | Admitting: Family Medicine

## 2023-10-21 LAB — BASIC METABOLIC PANEL
BUN/Creatinine Ratio: 19 (ref 9–23)
BUN: 15 mg/dL (ref 6–24)
CO2: 19 mmol/L — ABNORMAL LOW (ref 20–29)
Calcium: 9.7 mg/dL (ref 8.7–10.2)
Chloride: 103 mmol/L (ref 96–106)
Creatinine, Ser: 0.8 mg/dL (ref 0.57–1.00)
Glucose: 125 mg/dL — ABNORMAL HIGH (ref 70–99)
Potassium: 4.7 mmol/L (ref 3.5–5.2)
Sodium: 141 mmol/L (ref 134–144)
eGFR: 88 mL/min/{1.73_m2} (ref >59)

## 2023-10-21 NOTE — Telephone Encounter (Signed)
 Reason for CRM: Patient is calling in because she needs a refill on hydrOXYzine (ATARAX) 25 MG tablet. Patient would like something stronger, but if not she will stick with the 25mg . Dr.Daniel Janee Morn from [facility] wrote out the prescription. Patient is no longer under his care. Patient currently see Dr. Georganna Skeans. Patient is currently out of the medication. Patient can be contacted by phone [phone]. Listed below if the patient preferred pharmacy.        [facility]    [address] [place], [place] [address]    Phone Number: [phone]

## 2023-10-25 ENCOUNTER — Encounter: Payer: Self-pay | Admitting: Neurology

## 2023-10-25 ENCOUNTER — Telehealth (INDEPENDENT_AMBULATORY_CARE_PROVIDER_SITE_OTHER): Payer: Self-pay | Admitting: Primary Care

## 2023-10-25 ENCOUNTER — Telehealth (INDEPENDENT_AMBULATORY_CARE_PROVIDER_SITE_OTHER): Payer: MEDICAID | Admitting: Family Medicine

## 2023-10-25 DIAGNOSIS — E1169 Type 2 diabetes mellitus with other specified complication: Secondary | ICD-10-CM

## 2023-10-25 DIAGNOSIS — R632 Polyphagia: Secondary | ICD-10-CM | POA: Diagnosis not present

## 2023-10-25 DIAGNOSIS — Z7984 Long term (current) use of oral hypoglycemic drugs: Secondary | ICD-10-CM

## 2023-10-25 DIAGNOSIS — E119 Type 2 diabetes mellitus without complications: Secondary | ICD-10-CM

## 2023-10-25 MED ORDER — METFORMIN HCL ER 500 MG PO TB24: 500.0000 mg | ORAL_TABLET | Freq: Every day | ORAL | 1 refills | Status: DC

## 2023-10-25 NOTE — Progress Notes (Unsigned)
 Virtual Visit via Video Note  I connected with Kristen Rowland on 10/25/23 at 11:00 AM EST by a video enabled telemedicine application and verified that I am speaking with the correct person using two identifiers.  Location: Patient: [place] - home Provider: [place] - office   I discussed the limitations of evaluation and management by telemedicine and the availability of in person appointments. The patient expressed understanding and agreed to proceed.  History of Present Illness: Patient would like a home aide for assistance with bathing and other adl's. Patient also wants a med for her elevated blood glucose that was fasting.A recent A1c was 6.5. Patient reports polyphagia.    Observations/Objective:   Assessment and Plan: 1. Type 2 diabetes mellitus with other specified complication, without long-term current use of insulin (HCC) (Primary) Metformin prescribed  2. Diabetes mellitus treated with oral medication (HCC)    Follow Up Instructions: Patient to follow up in 3 months regarding diabetes. Patient to get paperwork from agency she desires for home care needs and make appt for completion.    I discussed the assessment and treatment plan with the patient. The patient was provided an opportunity to ask questions and all were answered. The patient agreed with the plan and demonstrated an understanding of the instructions.   The patient was advised to call back or seek an in-person evaluation if the symptoms worsen or if the condition fails to improve as anticipated.  I provided 9 minutes of non-face-to-face time during this encounter.   Tommie Raymond, MD

## 2023-10-25 NOTE — Telephone Encounter (Signed)
 Called pt to remind them about atp. Pt will be present

## 2023-10-26 ENCOUNTER — Ambulatory Visit (INDEPENDENT_AMBULATORY_CARE_PROVIDER_SITE_OTHER): Payer: MEDICAID | Admitting: Primary Care

## 2023-10-26 ENCOUNTER — Encounter: Payer: Self-pay | Admitting: Family Medicine

## 2023-10-28 ENCOUNTER — Ambulatory Visit: Payer: MEDICAID | Attending: Internal Medicine | Admitting: Internal Medicine

## 2023-10-28 ENCOUNTER — Ambulatory Visit: Payer: MEDICAID | Admitting: Internal Medicine

## 2023-10-28 ENCOUNTER — Encounter: Payer: Self-pay | Admitting: Internal Medicine

## 2023-10-28 ENCOUNTER — Encounter: Payer: Self-pay | Admitting: *Deleted

## 2023-10-28 ENCOUNTER — Telehealth: Payer: Self-pay

## 2023-10-28 VITALS — Ht 70.0 in | Wt 190.0 lb

## 2023-10-28 DIAGNOSIS — R002 Palpitations: Secondary | ICD-10-CM

## 2023-10-28 DIAGNOSIS — Z8673 Personal history of transient ischemic attack (TIA), and cerebral infarction without residual deficits: Secondary | ICD-10-CM

## 2023-10-28 NOTE — Progress Notes (Signed)
 Verified with check in desk that the patient has [employer] [facility]. Re-enrolled the patient for [employer] to ship a 30 day cardiac event monitor to her address on file.

## 2023-10-28 NOTE — Patient Instructions (Signed)
 Medication Instructions:  No changes *If you need a refill on your cardiac medications before your next appointment, please call your pharmacy*   Lab Work: No Labs If you have labs (blood work) drawn today and your tests are completely normal, you will receive your results only by: MyChart Message (if you have MyChart) OR A paper copy in the mail If you have any lab test that is abnormal or we need to change your treatment, we will call you to review the results.   Testing/Procedures: [employer] Cardiac Event Monitor Instructions  Your physician has requested you wear your cardiac event monitor for _30 days, . [employer] may call or text to confirm a shipping address. The monitor will be sent to a land address via UPS. [employer] will not ship a monitor to a PO BOX. It typically takes 3-5 days to receive your monitor after it has been enrolled. [employer] will assist with USPS tracking if your package is delayed. The telephone number for [employer] is [phone]. Once you have received your monitor, please review the enclosed instructions. Instruction tutorials can also be viewed under help and settings on the enclosed cell phone. Your monitor has already been registered assigning a specific monitor serial # to you.  Billing and Self Pay Discount Information  [employer] has been provided the insurance information we had on file for you.  If your insurance has been updated, please call [employer] at [phone] to provide them with your updated insurance information.   [employer] offers a discounted Self Pay option for patients who have insurance that does not cover their cardiac event monitor or patients without insurance.  The discounted cost of a Self Pay Cardiac Event Monitor would be $225.00 , if the patient contacts [employer] at [phone] within 7 days of applying the monitor to make payment arrangements.  If the patient does not contact [employer] within 7 days of applying  the monitor, the cost of the cardiac event monitor will be $350.00.  Applying the monitor  Remove cell phone from case and turn it on. The cell phone works as IT consultant and needs to be within [employer] of you at all times. The cell phone will need to be charged on a daily basis. We recommend you plug the cell phone into the enclosed charger at your bedside table every night.  Monitor batteries: You will receive two monitor batteries labelled #1 and #2. These are your recorders. Plug battery #2 onto the second connection on the enclosed charger. Keep one battery on the charger at all times. This will keep the monitor battery deactivated. It will also keep it fully charged for when you need to switch your monitor batteries. A small light will be blinking on the battery emblem when it is charging. The light on the battery emblem will remain on when the battery is fully charged.  Open package of a Monitor strip. Insert battery #1 into black hood on strip and gently squeeze monitor battery onto connection as indicated in instruction booklet. Set aside while preparing skin.  Choose location for your strip, vertical or horizontal, as indicated in the instruction booklet. Shave to remove all hair from location. There cannot be any lotions, oils, powders, or colognes on skin where monitor is to be applied. Wipe skin clean with enclosed Saline wipe. Dry skin completely.  Peel paper labeled #1 off the back of the Monitor strip exposing the adhesive. Place the monitor on the chest in the vertical or horizontal position shown in the  instruction booklet. One arrow on the monitor strip must be pointing upward. Carefully remove paper labeled #2, attaching remainder of strip to your skin. Try not to create any folds or wrinkles in the strip as you apply it.  Firmly press and release the circle in the center of the monitor battery. You will hear a small beep. This is turning the monitor battery on. The  heart emblem on the monitor battery will light up every 5 seconds if the monitor battery in turned on and connected to the patient securely. Do not push and hold the circle down as this turns the monitor battery off. The cell phone will locate the monitor battery. A screen will appear on the cell phone checking the connection of your monitor strip. This may read poor connection initially but change to good connection within the next minute. Once your monitor accepts the connection you will hear a series of 3 beeps followed by a climbing crescendo of beeps. A screen will appear on the cell phone showing the two monitor strip placement options. Touch the picture that demonstrates where you applied the monitor strip.  Your monitor strip and battery are waterproof. You are able to shower, bathe, or swim with the monitor on. They just ask you do not submerge deeper than 3 feet underwater. We recommend removing the monitor if you are swimming in a lake, river, or ocean.  Your monitor battery will need to be switched to a fully charged monitor battery approximately once a week. The cell phone will alert you of an action which needs to be made.  On the cell phone, tap for details to reveal connection status, monitor battery status, and cell phone battery status. The green dots indicates your monitor is in good status. A red dot indicates there is something that needs your attention.  To record a symptom, click the circle on the monitor battery. In 30-60 seconds a list of symptoms will appear on the cell phone. Select your symptom and tap save. Your monitor will record a sustained or significant arrhythmia regardless of you clicking the button. Some patients do not feel the heart rhythm irregularities. [employer] will notify us of any serious or critical events.  Refer to instruction booklet for instructions on switching batteries, changing strips, the Do not disturb or Pause features, or any  additional questions.  Call [employer] at [phone], to confirm your monitor is transmitting and record your baseline. They will answer any questions you may have regarding the monitor instructions at that time.  Returning the monitor to [employer]  Place all equipment back into blue box. Peel off strip of paper to expose adhesive and close box securely. There is a prepaid UPS shipping label on this box. Drop in a UPS drop box, or at a UPS facility like [employer]. You may also contact [employer] to arrange UPS to pick up monitor package at your home.    Follow-Up: At [facility], you and your health needs are our priority.  As part of our continuing mission to provide you with exceptional heart care, we have created designated Provider Care Teams.  These Care Teams include your primary Cardiologist (physician) and Advanced Practice Providers (APPs -  Physician Assistants and Nurse Practitioners) who all work together to provide you with the care you need, when you need it.  We recommend signing up for the patient portal called "MyChart".  Sign up information is provided on this After Visit Summary.  MyChart is used to connect  with patients for Virtual Visits (Telemedicine).  Patients are able to view lab/test results, encounter notes, upcoming appointments, etc.  Non-urgent messages can be sent to your provider as well.   To learn more about what you can do with MyChart, go to [email or web address].    Your next appointment:   Follow Up As Needed  Provider:   Maisie Fus, MD  Other Instructions   1st Floor: - Lobby - Registration  - Pharmacy  - Lab - Cafe  2nd Floor: - PV Lab - Diagnostic Testing (echo, CT, nuclear med)  3rd Floor: - Vacant  4th Floor: - TCTS (cardiothoracic surgery) - AFib Clinic - Structural Heart Clinic - Vascular Surgery  - Vascular Ultrasound  5th Floor: - HeartCare Cardiology (general and EP) - Clinical Pharmacy for coumadin,  hypertension, lipid, weight-loss medications, and med management appointments    Valet parking services will be available as well.

## 2023-10-28 NOTE — Progress Notes (Signed)
 Cardiology Office Note:  .   Date:  10/28/2023  ID:  Kristen Rowland, DOB [birth year], MRN [id number] PCP: Georganna Skeans, MD  [facility] Providers Cardiologist:  None {  Virtual Visit via Video Note  I connected with Kristen Rowland on 10/28/23 at  8:00 AM EST by a video enabled telemedicine application and verified that I am speaking with the correct person using two identifiers.  I discussed the limitations of evaluation and management by telemedicine and the availability of in person appointments. The patient expressed understanding and agreed to proceed.  I discussed the assessment and treatment plan with the patient. The patient was provided an opportunity to ask questions and all were answered. The patient agreed with the plan and demonstrated an understanding of the instructions.   The patient was advised to call back or seek an in-person evaluation if the symptoms worsen or if the condition fails to improve as anticipated.  I provided 15 minutes of non-face-to-face time during this encounter.  History of Present Illness: .   Kristen Rowland is a 53 y.o. female with hx of depression, etoh, who presented to the ER on 1/30  after heavy drinking with SI and p/w acute right eye diminished vision and she was found ot have an acute stroke. Brain MRI showed acute confluent left PCA territory infarcts with petechial hemorrhage and acute bilateral cerebellar infarct. Acute infarct in the right basal ganglia and right parietal lobe. 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 She had a TEE which was negative for an intracardiac source. 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  ROS:  per HPI otherwise negative   Studies Reviewed: Marland Kitchen       TEE 09/24/2023  1. Left ventricular  ejection fraction, by estimation, is 60 to 65%. The  left ventricle has normal function. The left ventricle has no regional  wall motion abnormalities.   2. Right ventricular systolic function is normal. The right ventricular  size is normal.   3. No left atrial/left atrial appendage thrombus was detected.   4. The mitral valve is normal in structure. Trivial mitral valve  regurgitation. No evidence of mitral stenosis.   5. Small mobile density on the AV leaflets seen on the 120 degree view  that likely represents Lambl's Excrescences but cannot rule out a very  small vegetation based on this study. If endocarditis is a concern  recommend checking inflammatory markers and  blood cultures.      . The aortic valve is tricuspid. Aortic valve regurgitation is not  visualized. No aortic stenosis is present.   6. The inferior vena cava is normal in size with greater than 50%  respiratory variability, suggesting right atrial pressure of 3 mmHg.   7. Agitated saline contrast bubble study was negative, with no evidence  of any interatrial shunt.   Conclusion(s)/Recommendation(s): Normal biventricular function without  evidence of hemodynamically significant valvular heart disease.    Risk Assessment/Calculations:    Physical Exam:   VS:  Ht 5\' 10"  (1.778 m)   Wt 190 lb (86.2 kg)   BMI 27.26 kg/m    Wt Readings from Last 3 Encounters:  10/28/23 190 lb (86.2 kg)  10/19/23 197 lb 9.6 oz (89.6 kg)  03/15/18 180 lb (81.6 kg)  Well appearing MMM Normal WOB No abdominal distension Normal Affect   ASSESSMENT AND PLAN: .   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

## 2023-10-28 NOTE — Telephone Encounter (Signed)
 Copied from CRM [id number]. Topic: Clinical - Prescription Issue >> Oct 28, 2023 11:01 AM Ivette P wrote: Reason for CRM: Pt was prescribed Metformin 500MG  for blood sugar but pt currently does not have an option to check blood sugar and pt is requesting if she could have a meter to be abel to track blood sugar. Pt callback [phone]

## 2023-11-03 ENCOUNTER — Other Ambulatory Visit: Payer: Self-pay | Admitting: Family Medicine

## 2023-11-03 MED ORDER — BLOOD GLUCOSE MONITORING SUPPL DEVI: 1.0000 | Freq: Three times a day (TID) | 0 refills | Status: DC

## 2023-11-03 MED ORDER — LANCETS MISC. MISC: 1.0000 | Freq: Three times a day (TID) | 0 refills | Status: AC

## 2023-11-03 MED ORDER — LANCET DEVICE MISC: 1.0000 | Freq: Three times a day (TID) | 0 refills | Status: AC

## 2023-11-03 MED ORDER — BLOOD GLUCOSE TEST VI STRP: 1.0000 | ORAL_STRIP | Freq: Three times a day (TID) | 0 refills | Status: DC

## 2023-11-04 ENCOUNTER — Ambulatory Visit: Payer: Self-pay | Admitting: Family Medicine

## 2023-11-04 LAB — HM DIABETES EYE EXAM

## 2023-11-04 NOTE — Telephone Encounter (Signed)
 noted

## 2023-11-04 NOTE — Telephone Encounter (Signed)
 Chief Complaint: Productive cough Symptoms: Heaviness on chest, green phlegm, vomiting Frequency: Intermittent x3 weeks Pertinent Negatives: Patient denies fever, chest pain difficulty breathing, hemoptysis, wheezing Disposition:  [x] Appointment(In office) Additional Notes: Pt states she has had a "really bad cold for at least 3 weeks." Pt states it is not getting better. Pt states she has heaviness in her chest that has caused her to vomit x2. Pt states she tried Mucinex. Tylenol severe cough, Benadryl and they did not help. Pt scheduled for an appt at different office tomorrow as first available at PCP office isn't until April. Pt also would like to discuss getting a blood sugar monitor as she started metformin and felt lethargic yesterday. This RN educated pt on home care, new-worsening symptoms, when to call back/seek emergent care. Pt verbalized understanding and agrees to plan.     Copied from CRM [id number]. Topic: Clinical - Red Word Triage >> Nov 04, 2023  4:16 PM Nyra Capes wrote: Red Word that prompted transfer to Nurse Triage: Patient is taking metFORMIN (GLUCOPHAGE-XR) 500 MG 24 hr tablet for blood sugar and labored breathing, coughing fits that make her vomit once in a while and has coughed up green hard stuff. Patient had stroke Jan 29th. Reason for Disposition  SEVERE coughing spells (e.g., whooping sound after coughing, vomiting after coughing)  Answer Assessment - Initial Assessment Questions 1. ONSET: "When did the cough begin?"      3 weeks; started in head and then moved to chest with cough 2. SPUTUM: "Describe the color of your sputum" (none, dry cough; clear, white, yellow, green)     Occasional green sputum 3. HEMOPTYSIS: "Are you coughing up any blood?" If so ask: "How much?" (flecks, streaks, tablespoons, etc.)     Denies 4. DIFFICULTY BREATHING: "Are you having difficulty breathing?" If Yes, ask: "How bad is it?" (e.g., mild, moderate, severe)    - MILD: No SOB at rest,  mild SOB with walking, speaks normally in sentences, can lie down, no retractions, pulse < 100.    - MODERATE: SOB at rest, SOB with minimal exertion and prefers to sit, cannot lie down flat, speaks in phrases, mild retractions, audible wheezing, pulse 100-120.    - SEVERE: Very SOB at rest, speaks in single words, struggling to breathe, sitting hunched forward, retractions, pulse > 120      Denies  Protocols used: Cough - Acute Productive-A-AH

## 2023-11-05 ENCOUNTER — Telehealth: Payer: Self-pay | Admitting: Family Medicine

## 2023-11-05 ENCOUNTER — Ambulatory Visit (INDEPENDENT_AMBULATORY_CARE_PROVIDER_SITE_OTHER): Payer: MEDICAID | Admitting: Family Medicine

## 2023-11-05 ENCOUNTER — Ambulatory Visit: Payer: MEDICAID

## 2023-11-05 ENCOUNTER — Encounter: Payer: Self-pay | Admitting: Family Medicine

## 2023-11-05 VITALS — BP 114/65 | HR 99 | Temp 98.9°F | Resp 16 | Ht 70.0 in | Wt 200.0 lb

## 2023-11-05 DIAGNOSIS — I959 Hypotension, unspecified: Secondary | ICD-10-CM | POA: Diagnosis not present

## 2023-11-05 DIAGNOSIS — R0602 Shortness of breath: Secondary | ICD-10-CM | POA: Diagnosis not present

## 2023-11-05 DIAGNOSIS — E1169 Type 2 diabetes mellitus with other specified complication: Secondary | ICD-10-CM

## 2023-11-05 DIAGNOSIS — R051 Acute cough: Secondary | ICD-10-CM | POA: Diagnosis not present

## 2023-11-05 MED ORDER — HYDROCODONE BIT-HOMATROP MBR 5-1.5 MG/5ML PO SOLN: 5.0000 mL | Freq: Two times a day (BID) | ORAL | 0 refills | Status: AC | PRN

## 2023-11-05 MED ORDER — PREDNISONE 20 MG PO TABS: 40.0000 mg | ORAL_TABLET | Freq: Every day | ORAL | 0 refills | Status: AC

## 2023-11-05 MED ORDER — BENZONATATE 100 MG PO CAPS: 100.0000 mg | ORAL_CAPSULE | Freq: Two times a day (BID) | ORAL | 0 refills | Status: AC | PRN

## 2023-11-05 MED ORDER — ACCU-CHEK AVIVA PLUS W/DEVICE KIT: PACK | 0 refills | Status: DC

## 2023-11-05 NOTE — Telephone Encounter (Deleted)
 Requesting TOC from A Wilson to B. [place] at [employer]. Please advise

## 2023-11-05 NOTE — Patient Instructions (Addendum)
 A few things to remember from today's visit:  Acute cough - Plan: DG Chest 2 View, HYDROcodone bit-homatropine (HYCODAN) 5-1.5 MG/5ML syrup, benzonatate (TESSALON) 100 MG capsule  Hypotension, unspecified hypotension type - Plan: CBC, Basic metabolic panel  SOB (shortness of breath) - Plan: predniSONE (DELTASONE) 20 MG tablet, CBC, Basic metabolic panel  Cough syrup at bedtime. Benzonatate during the day. For low blood pressure continue adequate hydration.  Prednisone to start in 48 hours if wheezing and with breakfast. Albuterol inh 2 puff every 6 hours for a week then as needed for wheezing or shortness of breath.  Heartburn can also be contributing to cough.  Please arrange follow up appt with PCP for 2 weeks follow up. If symptoms suddenly worse, seek immediate medical attention.   Do not use My Chart to request refills or for acute issues that need immediate attention. If you send a my chart message, it may take a few days to be addressed, specially if I am not in the office.  Please be sure medication list is accurate. If a new problem present, please set up appointment sooner than planned today.
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
## 2023-11-06 ENCOUNTER — Encounter: Payer: Self-pay | Admitting: Family Medicine

## 2023-11-06 LAB — CBC
Hematocrit: 44.3 % (ref 34.0–46.6)
Hemoglobin: 15.1 g/dL (ref 11.1–15.9)
MCH: 32.3 pg (ref 26.6–33.0)
MCHC: 34.1 g/dL (ref 31.5–35.7)
MCV: 95 fL (ref 79–97)
Platelets: 375 10*3/uL (ref 150–450)
RBC: 4.68 x10E6/uL (ref 3.77–5.28)
RDW: 12.7 % (ref 11.7–15.4)
WBC: 11.9 10*3/uL — ABNORMAL HIGH (ref 3.4–10.8)

## 2023-11-06 LAB — BASIC METABOLIC PANEL
BUN/Creatinine Ratio: 24 — ABNORMAL HIGH (ref 9–23)
BUN: 20 mg/dL (ref 6–24)
CO2: 19 mmol/L — ABNORMAL LOW (ref 20–29)
Calcium: 9.5 mg/dL (ref 8.7–10.2)
Chloride: 102 mmol/L (ref 96–106)
Creatinine, Ser: 0.82 mg/dL (ref 0.57–1.00)
Glucose: 96 mg/dL (ref 70–99)
Potassium: 4.6 mmol/L (ref 3.5–5.2)
Sodium: 140 mmol/L (ref 134–144)
eGFR: 85 mL/min/{1.73_m2} (ref >59)

## 2023-11-08 NOTE — Telephone Encounter (Signed)
 error

## 2023-11-09 ENCOUNTER — Ambulatory Visit: Payer: Self-pay | Admitting: Family Medicine

## 2023-11-09 MED ORDER — ACCU-CHEK GUIDE TEST VI STRP: ORAL_STRIP | 12 refills | Status: AC

## 2023-11-09 MED ORDER — ACCU-CHEK GUIDE W/DEVICE KIT: PACK | 0 refills | Status: AC

## 2023-11-09 MED ORDER — ACCU-CHEK SOFTCLIX LANCETS MISC: 12 refills | Status: DC

## 2023-11-09 MED ORDER — ACCU-CHEK SOFTCLIX LANCETS MISC: 12 refills | Status: AC

## 2023-11-09 MED ORDER — ACCU-CHEK GUIDE TEST VI STRP: ORAL_STRIP | 12 refills | Status: DC

## 2023-11-09 NOTE — Telephone Encounter (Signed)
 Copied from CRM [id number]. Topic: Clinical - Prescription Issue >> Nov 09, 2023 10:50 AM Turkey A wrote: Reason for CRM: Patient said that the Insulin Monitor at the pharmacy was over $200 which is too costly for her. Pt states she needs something covered under medicaid. PT states she needs strips and lancets-please contact patient

## 2023-11-09 NOTE — Addendum Note (Signed)
 Addended by: Weyman Croon E on: 11/09/2023 12:51 PM   Modules accepted: Orders

## 2023-11-10 ENCOUNTER — Ambulatory Visit: Payer: MEDICAID | Admitting: Family Medicine

## 2023-11-17 ENCOUNTER — Telehealth: Payer: Self-pay

## 2023-11-17 NOTE — Telephone Encounter (Signed)
 She needs to arrange appt with her PCP, I saw her for acute visit with respiratory symptoms. Thanks, BJ
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
## 2023-11-17 NOTE — Telephone Encounter (Signed)
 Sent message to RN lead to confirm if we assist with Glucose Meters for our patients . RN lead assisted with resources so I went ahead and gave patient a call. Patient stated that she did not need a meter and her call was about needing  an alternative medication because the metformin was not working. Also patient has transferred her care to Dr. [place] at [facility] and she will be seen 11/22/23. Patient is okay with discussing the medication change with Dr. [place] during their initial appt.  She expressed that it was nothing against Dr. Andrey Campanile . Family Medicine is closer to her home.

## 2023-11-22 ENCOUNTER — Ambulatory Visit: Payer: MEDICAID | Admitting: Family Medicine

## 2023-11-23 ENCOUNTER — Encounter: Payer: Self-pay | Admitting: Neurology

## 2023-11-23 ENCOUNTER — Ambulatory Visit: Payer: MEDICAID | Admitting: Neurology

## 2023-11-23 VITALS — BP 124/68 | HR 86 | Ht 70.0 in | Wt 199.0 lb

## 2023-11-23 DIAGNOSIS — I63212 Cerebral infarction due to unspecified occlusion or stenosis of left vertebral arteries: Secondary | ICD-10-CM

## 2023-11-23 DIAGNOSIS — Z91148 Patient's other noncompliance with medication regimen for other reason: Secondary | ICD-10-CM

## 2023-11-23 DIAGNOSIS — I6340 Cerebral infarction due to embolism of unspecified cerebral artery: Secondary | ICD-10-CM

## 2023-11-23 DIAGNOSIS — I6502 Occlusion and stenosis of left vertebral artery: Secondary | ICD-10-CM

## 2023-11-23 DIAGNOSIS — F17200 Nicotine dependence, unspecified, uncomplicated: Secondary | ICD-10-CM
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
## 2023-11-23 NOTE — Patient Instructions (Signed)
 CTA head and neck   Please take your medications as prescribed

## 2023-11-24 ENCOUNTER — Ambulatory Visit: Payer: MEDICAID | Admitting: Family Medicine

## 2023-11-25 ENCOUNTER — Encounter: Payer: Self-pay | Admitting: Neurology

## 2023-11-26 ENCOUNTER — Encounter: Payer: Self-pay | Admitting: Family Medicine

## 2023-11-29 ENCOUNTER — Other Ambulatory Visit: Payer: MEDICAID

## 2023-12-06 ENCOUNTER — Ambulatory Visit: Payer: MEDICAID | Attending: Internal Medicine

## 2023-12-06 DIAGNOSIS — R002 Palpitations: Secondary | ICD-10-CM

## 2023-12-20 ENCOUNTER — Ambulatory Visit
Admission: RE | Admit: 2023-12-20 | Discharge: 2023-12-20 | Disposition: A | Discharge status: Home / Self Care | Payer: MEDICAID | Source: Ambulatory Visit | Attending: Neurology | Admitting: Neurology

## 2023-12-20 DIAGNOSIS — I6340 Cerebral infarction due to embolism of unspecified cerebral artery: Secondary | ICD-10-CM

## 2023-12-20 DIAGNOSIS — I6502 Occlusion and stenosis of left vertebral artery: Secondary | ICD-10-CM

## 2023-12-21 ENCOUNTER — Encounter: Payer: Self-pay | Admitting: Family Medicine

## 2023-12-21 ENCOUNTER — Ambulatory Visit: Payer: MEDICAID | Admitting: Family Medicine

## 2023-12-21 VITALS — BP 130/89 | HR 80 | Temp 98.4°F | Resp 16 | Ht 70.0 in | Wt 202.2 lb

## 2023-12-21 DIAGNOSIS — E1169 Type 2 diabetes mellitus with other specified complication: Secondary | ICD-10-CM

## 2023-12-21 DIAGNOSIS — I6930 Unspecified sequelae of cerebral infarction: Secondary | ICD-10-CM | POA: Diagnosis not present

## 2023-12-21 DIAGNOSIS — F1020 Alcohol dependence, uncomplicated: Secondary | ICD-10-CM

## 2023-12-21 DIAGNOSIS — K625 Hemorrhage of anus and rectum: Secondary | ICD-10-CM | POA: Diagnosis not present

## 2023-12-21 DIAGNOSIS — F331 Major depressive disorder, recurrent, moderate: Secondary | ICD-10-CM

## 2023-12-21 DIAGNOSIS — Z1239 Encounter for other screening for malignant neoplasm of breast: Secondary | ICD-10-CM

## 2023-12-21 DIAGNOSIS — E119 Type 2 diabetes mellitus without complications: Secondary | ICD-10-CM | POA: Insufficient documentation

## 2023-12-21 DIAGNOSIS — R030 Elevated blood-pressure reading, without diagnosis of hypertension: Secondary | ICD-10-CM

## 2023-12-21 DIAGNOSIS — E782 Mixed hyperlipidemia: Secondary | ICD-10-CM

## 2023-12-21 DIAGNOSIS — Z1159 Encounter for screening for other viral diseases: Secondary | ICD-10-CM

## 2023-12-21 LAB — CBC
HCT: 43.2 % (ref 36.0–46.0)
Hemoglobin: 14.3 g/dL (ref 12.0–15.0)
MCHC: 33.1 g/dL (ref 30.0–36.0)
MCV: 95.8 fl (ref 78.0–100.0)
Platelets: 340 10*3/uL (ref 150.0–400.0)
RBC: 4.51 Mil/uL (ref 3.87–5.11)
RDW: 13.7 % (ref 11.5–15.5)
WBC: 10.4 10*3/uL (ref 4.0–10.5)

## 2023-12-21 LAB — BASIC METABOLIC PANEL WITH GFR
BUN: 16 mg/dL (ref 6–23)
CO2: 26 meq/L (ref 19–32)
Calcium: 9.3 mg/dL (ref 8.4–10.5)
Chloride: 104 meq/L (ref 96–112)
Creatinine, Ser: 0.67 mg/dL (ref 0.40–1.20)
GFR: 99.06 mL/min (ref >60.00)
Glucose, Bld: 117 mg/dL — ABNORMAL HIGH (ref 70–99)
Potassium: 4 meq/L (ref 3.5–5.1)
Sodium: 138 meq/L (ref 135–145)

## 2023-12-21 LAB — MICROALBUMIN / CREATININE URINE RATIO
Creatinine,U: 46.8 mg/dL
Microalb Creat Ratio: UNDETERMINED mg/g (ref 0.0–30.0)
Microalb, Ur: <0.7 mg/dL

## 2023-12-21 LAB — POCT GLYCOSYLATED HEMOGLOBIN (HGB A1C): Hemoglobin A1C: 5.7 % — AB (ref 4.0–5.6)
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
## 2023-12-21 NOTE — Assessment & Plan Note (Signed)
 Cholesterol done last in 08/2023, LDL 152. She is not longer on Atorvastatin , not sure why but reports side effects with different medications. Will plan on re-checking FLP next visit.

## 2023-12-21 NOTE — Patient Instructions (Addendum)
 A few things to remember from today's visit:  Type 2 diabetes mellitus with other specified complication, without long-term current use of insulin (HCC) - Plan: POC HgB A1c, Basic metabolic panel with GFR, Microalbumin / creatinine urine ratio, Amb Referral to Nutrition and Diabetic Education  Mixed hyperlipidemia  Encounter for HCV screening test for low risk patient - Plan: Hepatitis C antibody  Rectal bleeding - Plan: Ambulatory referral to Gastroenterology, CBC  Encounter for screening for malignant neoplasm of breast, unspecified screening modality - Plan: MM 3D SCREENING MAMMOGRAM BILATERAL BREAST  No changes today. Pending CT and then we can resume Plavix . Continue aspirin . Monitor blood pressure at home and let me know about readings in 2 weeks.  If you need refills for medications you take chronically, please call your pharmacy. Do not use My Chart to request refills or for acute issues that need immediate attention. If you send a my chart message, it may take a few days to be addressed, specially if I am not in the office.  Please be sure medication list is accurate. If a new problem present, please set up appointment sooner than planned today.

## 2023-12-21 NOTE — Assessment & Plan Note (Addendum)
 She was discharged on Atorvastatin  80 mg in 08/2023, she is not longer taking medication. Stopped Eliquis  because blood in stool and has not been on Plavix . Pending head and neck CTA tomorrow. We can consider resuming Plavix  after CT results. Encouraged smoking cessation.

## 2023-12-21 NOTE — Assessment & Plan Note (Signed)
 States that she has not drunken alcohol since last hospitalization, 08/2023.

## 2023-12-21 NOTE — Assessment & Plan Note (Signed)
 HgA1C has been at goal. She discontinued Metformin  2 months ago, not well tolerated. Regular exercise and healthy diet with avoidance of added sugar food intake is an important part of treatment and recommended. Annual eye exam, periodic dental and foot care recommended. F/U in 3-4 months.

## 2023-12-21 NOTE — Assessment & Plan Note (Signed)
 She is not on pharmacologic treatment and has not seen psychiatrist. Denies symptoms.

## 2023-12-22 ENCOUNTER — Other Ambulatory Visit: Payer: MEDICAID

## 2023-12-22 LAB — HEPATITIS C ANTIBODY: Hepatitis C Ab: NONREACTIVE

## 2023-12-22 MED ORDER — ATORVASTATIN CALCIUM 80 MG PO TABS: 80.0000 mg | ORAL_TABLET | Freq: Every day | ORAL | 3 refills | Status: DC

## 2023-12-24 ENCOUNTER — Other Ambulatory Visit: Payer: Self-pay | Admitting: Neurology

## 2023-12-24 ENCOUNTER — Ambulatory Visit: Admission: RE | Admit: 2023-12-24 | Payer: MEDICAID | Source: Ambulatory Visit

## 2023-12-24 ENCOUNTER — Ambulatory Visit
Admission: RE | Admit: 2023-12-24 | Discharge: 2023-12-24 | Disposition: A | Discharge status: Home / Self Care | Payer: MEDICAID | Source: Ambulatory Visit | Attending: Neurology | Admitting: Neurology

## 2023-12-24 ENCOUNTER — Other Ambulatory Visit: Payer: MEDICAID

## 2023-12-24 DIAGNOSIS — Z91148 Patient's other noncompliance with medication regimen for other reason: Secondary | ICD-10-CM

## 2023-12-24 DIAGNOSIS — I6502 Occlusion and stenosis of left vertebral artery: Secondary | ICD-10-CM

## 2023-12-24 DIAGNOSIS — I6340 Cerebral infarction due to embolism of unspecified cerebral artery: Secondary | ICD-10-CM

## 2023-12-24 DIAGNOSIS — F17200 Nicotine dependence, unspecified, uncomplicated: Secondary | ICD-10-CM

## 2023-12-24 MED ORDER — IOPAMIDOL (ISOVUE-370) INJECTION 76%
75.0000 mL | Freq: Once | INTRAVENOUS | Status: AC | PRN
  Administered 2023-12-24: 75 mL via INTRAVENOUS

## 2023-12-27 DIAGNOSIS — R002 Palpitations: Secondary | ICD-10-CM

## 2023-12-31 ENCOUNTER — Ambulatory Visit: Payer: Self-pay

## 2023-12-31 NOTE — Telephone Encounter (Signed)
 Noted.

## 2023-12-31 NOTE — Telephone Encounter (Signed)
 Chief Complaint: wants to be tested for Cushing's disease Symptoms: abd swelling, H/A, neck pain, fatigue, difficulty sleeping Frequency: swelling for 4-5 yrs, other symptoms since stroke in January Pertinent Negatives: Patient denies CP, SOB, rectal bleeding Disposition: [] ED /[] Urgent Care (no appt availability in office) / [x] Appointment(In office/virtual)/ []  [facility]/ [] Home Care/ [] Refused Recommended Disposition /[]  Mobile Bus/ []  Follow-up with PCP Additional Notes: Pt reports she would like to be tested for Cushing's disease. Pt reports she believes she has symptoms of Cushing's. Symptoms include neck pain, headaches, abdominal swelling, weight gain, fatigue, and difficulty sleeping. Pt states she suddenly gained 50 lbs 4-5 yrs ago and around that same time developed abdominal swelling after eating. Pt denies pain but states the swelling is tight and uncomfortable. Pt also reports fatigue. She states she can stand and walk, but moves slowly. RN scheduled pt for Monday AM at 0830. RN advised pt if she gets worse before then she should go to the ED. Pt verbalized understanding.    Copied from CRM [id number]. Topic: Clinical - Medical Advice >> Dec 31, 2023  8:38 AM [place] A wrote: Reason for CRM: Patient would like for Dr.Jordan to test her for Cushions Disease Reason for Disposition  Abdomen BLOATING is a chronic symptom (recurrent or ongoing AND present > 4 weeks)  Answer Assessment - Initial Assessment Questions 1. SYMPTOM: "What's the main symptom you're concerned about?" (e.g., abdomen bloating, swelling)     Abdominal swelling for 4-5 years, states she gained 50 lbs very quickly 2. ONSET: "When did swelling  start?"     4-5 yrs ago 3. SEVERITY: "How bad is the bloating or swelling?"    - BLOATING: Feels gassy or bloated. No visible swelling.     - MILD SWELLING: Feels gassy or bloated. Abdomen looks mildly distended or swollen.    - MODERATE - SEVERE  SWELLING: Abdomen looks very distended or swollen.      "I look pregnant, like 8 months pregnant" 4. ABDOMEN PAIN:  "Is there any abdomen pain?" If Yes, ask: "How bad is the pain?"  (e.g., Scale 1-10; mild, moderate, or severe)   - NONE (0): No pain.   - MILD (1-3): Doesn't interfere with normal activities, abdomen soft and not tender to touch.    - MODERATE (4-7): Interferes with normal activities or awakens from sleep, abdomen tender to touch.    - SEVERE (8-10): Excruciating pain, doubled over, unable to do any normal activities.       "More of a discomfort, tightness" - like a balloon about to pop after I eat 5. RELIEVING AND AGGRAVATING FACTORS: "What makes it better or worse?" (e.g., certain foods, lactose, medicines)     Worse after eating 6. GI HISTORY: "Do you have any history of stomach or intestine problems?" (e.g., bowel obstruction, cancer, irritable bowel)      No  7. CAUSE: "What do you think is causing the bloating?"      Cushing's disease 8. OTHER SYMPTOMS: "Do you have any other symptoms?" (e.g., belching, blood in stool, breathing difficulty, constipation, diarrhea, fever, passing gas, vomiting, weight loss, white of eyes have turned yellow)     Neck pain, H/A, abdominal swelling, fatigue, difficulty sleeping. Denies vomiting. Endorses nausea and some loose stools that are sometimes very watery. Denies SOB. "My stomach is larger above my navel than below." No fever. Weight gain of 50 lbs 4-5 yrs ago. "I did have blood in my stool, not recently, but after the  hospitalization, nothing in the last month." Endorses frequent belching "not every day." Denies yellowing of eyes or skin  Endorses concern of Cushing's disease - sleep problems, fatigue. Able to stand and walk today but "I go very slowly." Pt states she has felt that way since the stroke (in January 2025), not new  Protocols used: Abdomen Bloating and Swelling-A-AH

## 2024-01-03 ENCOUNTER — Encounter: Payer: Self-pay | Admitting: Family Medicine

## 2024-01-03 ENCOUNTER — Ambulatory Visit (INDEPENDENT_AMBULATORY_CARE_PROVIDER_SITE_OTHER): Payer: MEDICAID | Admitting: Family Medicine

## 2024-01-03 VITALS — BP 128/80 | HR 94 | Temp 98.3°F | Resp 16 | Ht 70.0 in | Wt 199.4 lb

## 2024-01-03 DIAGNOSIS — W57XXXA Bitten or stung by nonvenomous insect and other nonvenomous arthropods, initial encounter: Secondary | ICD-10-CM

## 2024-01-03 DIAGNOSIS — M542 Cervicalgia: Secondary | ICD-10-CM | POA: Diagnosis not present

## 2024-01-03 DIAGNOSIS — R5382 Chronic fatigue, unspecified: Secondary | ICD-10-CM

## 2024-01-03 DIAGNOSIS — Z0189 Encounter for other specified special examinations: Secondary | ICD-10-CM

## 2024-01-03 DIAGNOSIS — S20461A Insect bite (nonvenomous) of right back wall of thorax, initial encounter: Secondary | ICD-10-CM

## 2024-01-03 DIAGNOSIS — M791 Myalgia, unspecified site: Secondary | ICD-10-CM | POA: Diagnosis not present

## 2024-01-03 LAB — COMPREHENSIVE METABOLIC PANEL WITH GFR
ALT: 18 U/L (ref 0–35)
AST: 17 U/L (ref 0–37)
Albumin: 4.3 g/dL (ref 3.5–5.2)
Alkaline Phosphatase: 93 U/L (ref 39–117)
BUN: 13 mg/dL (ref 6–23)
CO2: 25 meq/L (ref 19–32)
Calcium: 9.4 mg/dL (ref 8.4–10.5)
Chloride: 104 meq/L (ref 96–112)
Creatinine, Ser: 0.73 mg/dL (ref 0.40–1.20)
GFR: 93.18 mL/min (ref >60.00)
Glucose, Bld: 132 mg/dL — ABNORMAL HIGH (ref 70–99)
Potassium: 4.1 meq/L (ref 3.5–5.1)
Sodium: 141 meq/L (ref 135–145)
Total Bilirubin: 0.4 mg/dL (ref 0.2–1.2)
Total Protein: 7.7 g/dL (ref 6.0–8.3)

## 2024-01-03 LAB — CBC
HCT: 45.3 % (ref 36.0–46.0)
Hemoglobin: 15.1 g/dL — ABNORMAL HIGH (ref 12.0–15.0)
MCHC: 33.4 g/dL (ref 30.0–36.0)
MCV: 95.8 fl (ref 78.0–100.0)
Platelets: 378 10*3/uL (ref 150.0–400.0)
RBC: 4.72 Mil/uL (ref 3.87–5.11)
RDW: 13.9 % (ref 11.5–15.5)
WBC: 10.9 10*3/uL — ABNORMAL HIGH (ref 4.0–10.5)

## 2024-01-03 LAB — TSH: TSH: 2.92 u[IU]/mL (ref 0.35–5.50)

## 2024-01-03 LAB — C-REACTIVE PROTEIN: CRP: 1.4 mg/dL (ref 0.5–20.0)

## 2024-01-03 LAB — CK: Total CK: 38 U/L (ref 7–177)

## 2024-01-03 NOTE — Patient Instructions (Addendum)
 A few things to remember from today's visit:  Patient request for diagnostic testing - Plan: Cortisol, free, Serum  Chronic fatigue - Plan: CBC, C-reactive protein, Comprehensive metabolic panel with GFR, TSH, CK  Tick bite of right back wall of thorax, initial encounter - Plan: B. burgdorfi Antibody  Myalgia - Plan: CK  Consider establishing with gynecologist to have pap smear done. Arrange appt for mammogram. You are overdue for colonoscopy.  If you need refills for medications you take chronically, please call your pharmacy. Do not use My Chart to request refills or for acute issues that need immediate attention. If you send a my chart message, it may take a few days to be addressed, specially if I am not in the office.  Please be sure medication list is accurate. If a new problem present, please set up appointment sooner than planned today.
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
## 2024-01-05 ENCOUNTER — Other Ambulatory Visit: Payer: Self-pay | Admitting: Medical Genetics

## 2024-01-08 ENCOUNTER — Ambulatory Visit: Payer: Self-pay | Admitting: Family Medicine

## 2024-01-10 ENCOUNTER — Other Ambulatory Visit (HOSPITAL_COMMUNITY)
Admission: RE | Admit: 2024-01-10 | Discharge: 2024-01-10 | Disposition: A | Discharge status: Home / Self Care | Payer: MEDICAID | Source: Ambulatory Visit | Attending: Medical Genetics | Admitting: Medical Genetics

## 2024-01-11 ENCOUNTER — Encounter: Payer: Self-pay | Admitting: Neurology

## 2024-01-11 ENCOUNTER — Telehealth: Payer: Self-pay | Admitting: Cardiology

## 2024-01-11 ENCOUNTER — Ambulatory Visit: Payer: Self-pay

## 2024-01-11 LAB — B. BURGDORFI ANTIBODIES: B burgdorferi Ab IgG+IgM: <0.9 {index}

## 2024-01-11 LAB — CORTISOL, FREE: Cortisol Free, Ser: 0.33 ug/dL

## 2024-01-11 NOTE — Telephone Encounter (Signed)
 Pt calling to get Monitor Results

## 2024-01-11 NOTE — Telephone Encounter (Signed)
 [name], Kristen Rowland   Caller: Unspecified (Today,  2:13 PM) Patient scheduled for 08/08 with Dr. Filiberto Hug - [name]

## 2024-01-11 NOTE — Telephone Encounter (Signed)
 Called Radiology and I have requested for CTA to be read.

## 2024-01-11 NOTE — Telephone Encounter (Signed)
 Study Highlights  NSR and sinus tachycardia No PAF or significant arrhythmias Symptoms of chest pain flutter fatigue dyspnea do not correlate with any arrhythmias    Janelle Mediate MD [facility]   The patient has been notified of the result and verbalized understanding.  All questions (if any) were answered.   Pt states our office was suppose to set her up with a new [facility] for sometime in Aug/Sept, being her former Cardiologist Dr. Amanda Jungling, is no longer with the practice.    I do not see a recall for this, so I will send this message to our scheduling team to call the pt back to arrange her an appt with a new [facility], for sometime in Aug/Sept, to establish with.

## 2024-01-12 ENCOUNTER — Ambulatory Visit: Payer: MEDICAID

## 2024-01-12 ENCOUNTER — Ambulatory Visit: Payer: Self-pay | Admitting: Neurology

## 2024-01-16 LAB — GENECONNECT MOLECULAR SCREEN: Genetic Analysis Overall Interpretation: NEGATIVE

## 2024-01-20 ENCOUNTER — Encounter: Payer: Self-pay | Admitting: Family Medicine

## 2024-01-27 ENCOUNTER — Ambulatory Visit: Payer: MEDICAID

## 2024-02-08 ENCOUNTER — Ambulatory Visit: Payer: MEDICAID | Admitting: Family Medicine

## 2024-02-11 ENCOUNTER — Ambulatory Visit: Payer: MEDICAID | Admitting: Dietician

## 2024-02-15 ENCOUNTER — Ambulatory Visit: Payer: MEDICAID | Admitting: Family Medicine

## 2024-02-15 ENCOUNTER — Ambulatory Visit: Payer: MEDICAID

## 2024-02-15 VITALS — BP 122/84 | HR 96 | Temp 98.4°F | Ht 70.0 in | Wt 201.0 lb

## 2024-02-15 DIAGNOSIS — Z124 Encounter for screening for malignant neoplasm of cervix: Secondary | ICD-10-CM | POA: Diagnosis not present

## 2024-02-15 DIAGNOSIS — E1169 Type 2 diabetes mellitus with other specified complication: Secondary | ICD-10-CM | POA: Diagnosis not present

## 2024-02-15 DIAGNOSIS — R413 Other amnesia: Secondary | ICD-10-CM

## 2024-02-15 DIAGNOSIS — Z8673 Personal history of transient ischemic attack (TIA), and cerebral infarction without residual deficits: Secondary | ICD-10-CM

## 2024-02-15 DIAGNOSIS — R519 Headache, unspecified: Secondary | ICD-10-CM | POA: Diagnosis not present
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
## 2024-03-09 ENCOUNTER — Ambulatory Visit: Payer: MEDICAID | Admitting: Family Medicine

## 2024-03-22 ENCOUNTER — Ambulatory Visit: Payer: MEDICAID | Admitting: Family Medicine

## 2024-03-28 ENCOUNTER — Ambulatory Visit: Payer: MEDICAID | Admitting: Neurology

## 2024-03-31 ENCOUNTER — Ambulatory Visit: Payer: MEDICAID | Admitting: Cardiology

## 2024-04-19 ENCOUNTER — Ambulatory Visit: Payer: MEDICAID | Admitting: Family Medicine

## 2024-05-10 ENCOUNTER — Ambulatory Visit: Payer: MEDICAID | Admitting: Neurology

## 2024-05-10 ENCOUNTER — Encounter: Payer: Self-pay | Admitting: Neurology

## 2024-05-12 ENCOUNTER — Ambulatory Visit: Payer: MEDICAID | Admitting: Cardiology

## 2024-07-06 ENCOUNTER — Encounter: Payer: Self-pay | Admitting: Cardiology

## 2024-07-07 ENCOUNTER — Ambulatory Visit: Payer: MEDICAID | Attending: Cardiology | Admitting: Cardiology

## 2024-07-07 ENCOUNTER — Other Ambulatory Visit: Payer: Self-pay

## 2024-07-07 ENCOUNTER — Encounter: Payer: Self-pay | Admitting: Cardiology

## 2024-07-07 VITALS — BP 103/69 | HR 85 | Ht 70.0 in | Wt 216.0 lb

## 2024-07-07 DIAGNOSIS — F17210 Nicotine dependence, cigarettes, uncomplicated: Secondary | ICD-10-CM

## 2024-07-07 DIAGNOSIS — I358 Other nonrheumatic aortic valve disorders: Secondary | ICD-10-CM | POA: Diagnosis not present

## 2024-07-07 DIAGNOSIS — E782 Mixed hyperlipidemia: Secondary | ICD-10-CM

## 2024-07-07 DIAGNOSIS — I6310 Cerebral infarction due to embolism of unspecified precerebral artery: Secondary | ICD-10-CM

## 2024-07-07 NOTE — Patient Instructions (Addendum)
  Lab Work: CBC LIPID PANEL   If you have labs (blood work) drawn today and your tests are completely normal, you will receive your results only by: MyChart Message (if you have MyChart) OR A paper copy in the mail If you have any lab test that is abnormal or we need to change your treatment, we will call you to review the results.  Testing/Procedures: CARDIAC MRI     You are scheduled for Cardiac MRI at the location below.   [facility] [address] [place], [place] [address] Please take advantage of the free valet parking available at the [facility] and [employer] (Entrance C).  Proceed to the [facility] Radiology Department (First Floor) for check-in.   Magnetic resonance imaging (MRI) is a painless test that produces images of the inside of the body without using Xrays.  During an MRI, strong magnets and radio waves work together in a data processing manager to form detailed images.   MRI images may provide more details about a medical condition than X-rays, CT scans, and ultrasounds can provide.  You may be given earphones to listen for instructions.  You may eat a light breakfast and take medications as ordered with the exception of furosemide, hydrochlorothiazide, chlorthalidone or spironolactone (or any other fluid pill). If you are undergoing a stress MRI, please avoid stimulants for 12 hr prior to test. (I.e. Caffeine , nicotine , chocolate, or antihistamine medications)  If your provider has ordered anti-anxiety medications for this test, then you will need a driver.  An IV will be inserted into one of your veins. Contrast material will be injected into your IV. It will leave your body through your urine within a day. You may be told to drink plenty of fluids to help flush the contrast material out of your system.  You will be asked to remove all metal, including: Watch, jewelry, and other metal objects including hearing aids, hair pieces and dentures. Also  wearable glucose monitoring systems (ie. Freestyle Libre and Omnipods) (Braces and fillings normally are not a problem.)   TEST WILL TAKE APPROXIMATELY 1 HOUR  PLEASE NOTIFY SCHEDULING AT LEAST 24 HOURS IN ADVANCE IF YOU ARE UNABLE TO KEEP YOUR APPOINTMENT. [phone]  For more information and frequently asked questions, please visit our website : [email or web address]  Please call the Cardiac Imaging Nurse Navigators with any questions/concerns. [phone] Office    Follow-Up: At [facility], you and your health needs are our priority.  As part of our continuing mission to provide you with exceptional heart care, our providers are all part of one team.  This team includes your primary Cardiologist (physician) and Advanced Practice Providers or APPs (Physician Assistants and Nurse Practitioners) who all work together to provide you with the care you need, when you need it.  Your next appointment:   10/2024  (pt wants afternoon appointment)  Provider:   Dr. Elmira   We recommend signing up for the patient portal called MyChart.  Sign up information is provided on this After Visit Summary.  MyChart is used to connect with patients for Virtual Visits (Telemedicine).  Patients are able to view lab/test results, encounter notes, upcoming appointments, etc.  Non-urgent messages can be sent to your provider as well.   To learn more about what you can do with MyChart, go to [email or web address].

## 2024-07-07 NOTE — Progress Notes (Signed)
 Cardiology Office Note:  .   Date:  07/07/2024  ID:  Kristen Rowland, DOB August 20, 1971, MRN [id number] PCP: No primary care provider on file.  [facility] Providers Cardiologist:  Newman Lawrence, MD PCP: No primary care provider on file.  Chief Complaint  Patient presents with   Stroke     Kristen Rowland is a 53 y.o. female with hyperlipidemia, nicotine  dependence, h/o multifocal stroke (08/2023), aortic valve Lambl's excrescences  Discussed the use of AI scribe software for clinical note transcription with the patient, who gave verbal consent to proceed.  History of Present Illness Kristen Rowland is a 53 year old female with a history of stroke who presents for follow-up regarding her stroke and cardiac evaluation.    Patient was last seen by Dr. Alvan in 10/2023.  In 08/2023, patient had an episode of acute loss of vision in right eye.  Brain MRI showed acute confluent left PCA territory infarct with petechial hemorrhages and acute bilateral cerebellar infarct.  There was also acute infarct in right basal cavity and right parietal lobe.  CT angiogram head/neck showed severe stenosis of left vertebral artery with possible intramural thrombus.  TEE showed a small density on aortic valve, thought to be Lambl's excrescences.  There was no PFO on TEE.  Bubble study was negative.  Given left vertebral artery thrombosis, she was started on Eliquis .  She subsequently underwent 30-day monitor placement that did not show any significant arrhythmia.  Patient did not take Eliquis , but has been on aspirin  80 mg daily since then.  She is not been compliant with Lipitor , thinks that it caused some side effects.  She was evaluated by neurologist Dr. Tobie, has follow-up again in 09/2024.    Vitals:   07/07/24 1521  BP: 103/69  Pulse: 85  SpO2: 96%      Review of Systems  Cardiovascular:  Negative for chest pain, dyspnea on exertion, leg swelling, palpitations and syncope.   Neurological:        Loss of vision since stroke        Studies Reviewed: SABRA        EKG 07/07/2024: Normal sinus rhythm Normal ECG When compared with ECG of 22-Sep-2023 21:54, No significant change was found     CTA head/neck 12/2023: No acute intracranial abnormality. Now chronic infarcts of the left parietooccipital lobes and bilateral cerebellum.   Plaque at the left vertebral origin with probable resolution of the intraluminal filling defect. Evaluation is limited by motion and streak artifact at this location.  CTA head/neck 08/2023: 1. 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 Eccentric intraluminal filling defect at this location is suspicious for intramural thrombus. 2. No emergent large vessel occlusion.   MRI brain 08/2023: 1. Acute confluent left PCA territory infarcts with petechial hemorrhage. 2. Acute bilateral cerebellar infarcts. 3. Punctate acute infarct in the right basal ganglia and right parietal lobe. 4. No midline shift.    Event monitor 11/2023: NSR and sinus tachycardia No PAF or significant arrhythmias Symptoms of chest pain flutter fatigue dyspnea do not correlate with any arrhythmias    TEE 08/2023:  1. Left ventricular ejection fraction, by estimation, is 60 to 65%. The  left ventricle has normal function. The left ventricle has no regional  wall motion abnormalities.   2. Right ventricular systolic function is normal. The right ventricular  size is normal.   3. No left atrial/left atrial appendage thrombus was detected.   4. The mitral valve  is normal in structure. Trivial mitral valve  regurgitation. No evidence of mitral stenosis.   5. Small mobile density on the AV leaflets seen on the 120 degree view  that likely represents Lambl's Excrescences but cannot rule out a very  small vegetation based on this study. If endocarditis is a concern  recommend checking inflammatory markers and blood cultures.      . The aortic valve  is tricuspid. Aortic valve regurgitation is not  visualized. No aortic stenosis is present.   6. The inferior vena cava is normal in size with greater than 50%  respiratory variability, suggesting right atrial pressure of 3 mmHg.   7. Agitated saline contrast bubble study was negative, with no evidence  of any interatrial shunt.   Conclusion(s)/Recommendation(s): Normal biventricular function without  evidence of hemodynamically significant valvular heart disease.   Labs 1-12/2023: Chol 228, TG 206, HDL 35, LDL 152 HbA1C 5.7% Hb 15.1 Cr 0.73 TSH 2.9   Physical Exam Vitals and nursing note reviewed.  Constitutional:      General: She is not in acute distress. Neck:     Vascular: No JVD.  Cardiovascular:     Rate and Rhythm: Normal rate and regular rhythm.     Heart sounds: Normal heart sounds. No murmur heard. Pulmonary:     Effort: Pulmonary effort is normal.     Breath sounds: Normal breath sounds. No wheezing or rales.  Musculoskeletal:     Right lower leg: No edema.     Left lower leg: No edema.      VISIT DIAGNOSES:   ICD-10-CM   1. 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 Cerebrovascular accident (CVA) due to embolism of precerebral artery (HCC)  I63.10 MR CARDIAC MORPHOLOGY W WO CONTRAST    3. Mixed hyperlipidemia  E78.2 EKG 12-Lead    CBC    Lipid panel    4. Cigarette nicotine  dependence without complication  F17.210        Kristen Rowland is a 53 y.o. 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 Subsequently monitor did not show A-fib. While she has risk factors for stroke including hyperlipidemia and nicotine  dependence, that does not explain her multifocal stroke without any persistent large vessel occlusion. Small density on aortic valve thought to  be Lambl's excrescences on TEE.  No PFO, bubble study negative. Lambl's excrescence is rarely because of stroke.  That said, there is no clear etiology identified for her multifocal stroke. I will obtain cardiac MRI for further evaluate her aortic valve. Continue aspirin  81 mg daily. Patient is open to taking Crestor 20 mg daily, but wants to check lipid panel today first before making the decision.  Nicotine  dependence: Tobacco cessation counseling: - Currently smoking 1 packs/day   - Patient was informed of the dangers of tobacco abuse including stroke, cancer, and MI, as well as benefits of tobacco cessation. - Patient is NOT willing to quit at this time. - Approximately 4 mins were spent counseling patient cessation techniques. We discussed various methods to help quit smoking, including deciding on a date to quit, joining a support group, pharmacological agents. Patient would like to hold off for now. - I will reassess her progress at the next follow-up visit       F/u in 10/2024  Signed, Newman JINNY Lawrence, MD

## 2024-07-08 ENCOUNTER — Ambulatory Visit: Payer: Self-pay | Admitting: Cardiology

## 2024-07-08 ENCOUNTER — Encounter: Payer: Self-pay | Admitting: Cardiology

## 2024-07-08 DIAGNOSIS — E782 Mixed hyperlipidemia: Secondary | ICD-10-CM

## 2024-07-08 LAB — LIPID PANEL
Chol/HDL Ratio: 8.1 ratio — ABNORMAL HIGH (ref 0.0–4.4)
Cholesterol, Total: 333 mg/dL — ABNORMAL HIGH (ref 100–199)
HDL: 41 mg/dL (ref >39)
LDL Chol Calc (NIH): 231 mg/dL — ABNORMAL HIGH (ref 0–99)
Triglycerides: 290 mg/dL — ABNORMAL HIGH (ref 0–149)
VLDL Cholesterol Cal: 61 mg/dL — ABNORMAL HIGH (ref 5–40)

## 2024-07-08 LAB — CBC
Hematocrit: 46.5 % (ref 34.0–46.6)
Hemoglobin: 16 g/dL — ABNORMAL HIGH (ref 11.1–15.9)
MCH: 32.7 pg (ref 26.6–33.0)
MCHC: 34.4 g/dL (ref 31.5–35.7)
MCV: 95 fL (ref 79–97)
Platelets: 331 x10E3/uL (ref 150–450)
RBC: 4.89 x10E6/uL (ref 3.77–5.28)
RDW: 12.8 % (ref 11.7–15.4)
WBC: 12.1 x10E3/uL — ABNORMAL HIGH (ref 3.4–10.8)

## 2024-07-08 NOTE — Progress Notes (Signed)
 Cholesterol is very high, even higher than what it was in 08/2024. Absolutely recommend Crestor 40 mg daily and repeat lipid panel in 4 weeks. If not improved, may need to add additional agents.  Thanks MJP

## 2024-07-10 MED ORDER — ROSUVASTATIN CALCIUM 40 MG PO TABS: 40.0000 mg | ORAL_TABLET | Freq: Every day | ORAL | 3 refills | Status: AC

## 2024-07-17 ENCOUNTER — Telehealth: Payer: Self-pay | Admitting: General Practice

## 2024-07-17 ENCOUNTER — Encounter (HOSPITAL_COMMUNITY): Payer: Self-pay

## 2024-07-17 NOTE — Telephone Encounter (Signed)
 Copied from CRM [id number]. Topic: Appointments - Appointment Scheduling >> Jul 17, 2024  2:12 PM Rea ORN wrote: Pt called to schedule appt with PCP. There is no PCP on file for pt. Pt had an appt with Philippe on 02/15/24 to establish but the notes say the pt left before the appt ended. Pt stated that she did stay the duration of that appt and when the NP left the room, she thought everything was complete and all she needed was paperwork. Pt also noted that she does not drive and her transportation had to leave for their appt.   Pt is concerned with diabetes management and would like to be seen. Pt also said that she is not new to the practice.  Please call back [phone]

## 2024-07-17 NOTE — Telephone Encounter (Signed)
 Called to let pt know that according to Dr appt notes she left the appt before being seen by the provider on 6/24 thus not completing her est of care appt. Pt argued back and stated that wasn't true and that the appt was over and she was waiting for the provider to bring her the AVS papers so she could leave. Let pt know that she seen the CMA and not the provider but pt assisted that she did. Pt stated that she feels like she is being treated poorly because she has medicaid and I ensure her that wasn't the case.  Moved on to let pt know that the provider will give her another chance to sched an appt; tried sched a TOC appt which brought us  into Dec for the next available however pt stated that she needed to be seen on Wed. 11/26 specifically for transportation; let pt know that the provider didn't have anything available for that day and the next available is Dec since she is establishing care with this provider.  Pt then stated nevermind she is writing a complaint against Dr. Jordan because she had no right to dismiss her care for missing one appointment; let pt know that her care was dismissed because she missed her New Patient appt with Dr. Jordan even though she has seen Dr Jordan for acute visits prior, she never established care with her. Let patient know that if she missed the fist established appointment then she would have to schedule with another provider.   Pt stated that she was not going to argue with me about it because she have notes; I ended the call.   FYI

## 2024-07-19 ENCOUNTER — Other Ambulatory Visit: Payer: Self-pay | Admitting: Cardiology

## 2024-07-19 ENCOUNTER — Ambulatory Visit (HOSPITAL_COMMUNITY)
Admission: RE | Admit: 2024-07-19 | Discharge: 2024-07-19 | Disposition: A | Discharge status: Home / Self Care | Payer: MEDICAID | Source: Ambulatory Visit | Attending: Cardiology | Admitting: Cardiology

## 2024-07-19 DIAGNOSIS — I6310 Cerebral infarction due to embolism of unspecified precerebral artery: Secondary | ICD-10-CM

## 2024-07-19 DIAGNOSIS — I358 Other nonrheumatic aortic valve disorders: Secondary | ICD-10-CM | POA: Insufficient documentation

## 2024-07-19 MED ORDER — GADOBUTROL 1 MMOL/ML IV SOLN
10.0000 mL | Freq: Once | INTRAVENOUS | Status: AC | PRN
  Administered 2024-07-19: 10 mL via INTRAVENOUS

## 2024-09-28 ENCOUNTER — Ambulatory Visit: Payer: MEDICAID | Admitting: Cardiology

## 2024-10-16 ENCOUNTER — Ambulatory Visit: Payer: MEDICAID | Admitting: Neurology

## 2024-10-27 ENCOUNTER — Ambulatory Visit: Payer: MEDICAID | Admitting: Cardiology
